# Patient Record
Sex: Female | Born: 1980 | Race: White | Hispanic: No | Marital: Married | Smoking: Never smoker
Health system: Southern US, Community
[De-identification: ages and names within clinical notes are randomized; demographics above are authoritative.]

## PROBLEM LIST (undated history)

## (undated) ENCOUNTER — Inpatient Hospital Stay (HOSPITAL_COMMUNITY): Payer: Self-pay

## (undated) DIAGNOSIS — Z789 Other specified health status: Secondary | ICD-10-CM

## (undated) HISTORY — DX: Other specified health status: Z78.9

## (undated) HISTORY — PX: NO PAST SURGERIES: SHX2092

---

## 2012-09-07 ENCOUNTER — Other Ambulatory Visit (HOSPITAL_COMMUNITY)
Admission: RE | Admit: 2012-09-07 | Discharge: 2012-09-07 | Disposition: A | Discharge status: Home / Self Care | Payer: Commercial Indemnity | Source: Ambulatory Visit | Attending: Obstetrics and Gynecology | Admitting: Obstetrics and Gynecology

## 2012-09-07 DIAGNOSIS — Z01419 Encounter for gynecological examination (general) (routine) without abnormal findings: Secondary | ICD-10-CM | POA: Insufficient documentation

## 2012-09-07 DIAGNOSIS — Z113 Encounter for screening for infections with a predominantly sexual mode of transmission: Secondary | ICD-10-CM | POA: Insufficient documentation

## 2012-12-09 ENCOUNTER — Inpatient Hospital Stay (HOSPITAL_COMMUNITY): Admission: AD | Admit: 2012-12-09 | Payer: Self-pay | Source: Ambulatory Visit | Admitting: Obstetrics and Gynecology

## 2013-05-02 LAB — OB RESULTS CONSOLE ABO/RH: RH TYPE: POSITIVE

## 2013-05-02 LAB — OB RESULTS CONSOLE ANTIBODY SCREEN: Antibody Screen: NEGATIVE

## 2013-05-02 LAB — OB RESULTS CONSOLE RUBELLA ANTIBODY, IGM: Rubella: IMMUNE

## 2013-05-02 LAB — OB RESULTS CONSOLE HIV ANTIBODY (ROUTINE TESTING): HIV: NONREACTIVE

## 2013-05-02 LAB — OB RESULTS CONSOLE RPR: RPR: NONREACTIVE

## 2013-05-02 LAB — OB RESULTS CONSOLE HEPATITIS B SURFACE ANTIGEN: Hepatitis B Surface Ag: NEGATIVE

## 2013-05-22 LAB — OB RESULTS CONSOLE GC/CHLAMYDIA
Chlamydia: NEGATIVE
Gonorrhea: NEGATIVE

## 2013-11-22 LAB — OB RESULTS CONSOLE GBS: GBS: NEGATIVE

## 2013-12-06 ENCOUNTER — Other Ambulatory Visit (HOSPITAL_COMMUNITY): Payer: Self-pay | Admitting: Obstetrics and Gynecology

## 2013-12-06 DIAGNOSIS — O26849 Uterine size-date discrepancy, unspecified trimester: Secondary | ICD-10-CM

## 2013-12-07 ENCOUNTER — Other Ambulatory Visit (HOSPITAL_COMMUNITY): Payer: Self-pay | Admitting: Obstetrics and Gynecology

## 2013-12-07 ENCOUNTER — Ambulatory Visit (HOSPITAL_COMMUNITY)
Admission: RE | Admit: 2013-12-07 | Discharge: 2013-12-07 | Disposition: A | Discharge status: Home / Self Care | Payer: BC Managed Care – PPO | Source: Ambulatory Visit | Attending: Obstetrics and Gynecology | Admitting: Obstetrics and Gynecology

## 2013-12-07 DIAGNOSIS — Z3689 Encounter for other specified antenatal screening: Secondary | ICD-10-CM | POA: Insufficient documentation

## 2013-12-07 DIAGNOSIS — O365990 Maternal care for other known or suspected poor fetal growth, unspecified trimester, not applicable or unspecified: Secondary | ICD-10-CM

## 2013-12-07 DIAGNOSIS — O26849 Uterine size-date discrepancy, unspecified trimester: Secondary | ICD-10-CM

## 2013-12-14 ENCOUNTER — Inpatient Hospital Stay (HOSPITAL_COMMUNITY)
Admission: AD | Admit: 2013-12-14 | Discharge: 2013-12-15 | Disposition: A | Discharge status: Home / Self Care | Payer: BC Managed Care – PPO | Source: Ambulatory Visit | Attending: Obstetrics and Gynecology | Admitting: Obstetrics and Gynecology

## 2013-12-14 ENCOUNTER — Encounter (HOSPITAL_COMMUNITY): Payer: Self-pay | Admitting: *Deleted

## 2013-12-14 DIAGNOSIS — O368130 Decreased fetal movements, third trimester, not applicable or unspecified: Secondary | ICD-10-CM

## 2013-12-14 DIAGNOSIS — O479 False labor, unspecified: Secondary | ICD-10-CM | POA: Insufficient documentation

## 2013-12-14 DIAGNOSIS — O368190 Decreased fetal movements, unspecified trimester, not applicable or unspecified: Secondary | ICD-10-CM | POA: Insufficient documentation

## 2013-12-14 NOTE — MAU Note (Signed)
PT SAYS   THE  BABY NL MOVES A LOT-    BUT ALL DAY TODAY-   MOVING LESS.   IN TRIAGE- FHR- 133.   LAST ATE AT 10PM-  CHOCOLATE-.    NO VE IN OFFICE.    DENIES  HSV AND MRSA.

## 2013-12-14 NOTE — MAU Provider Note (Signed)
Chief Complaint:  Decreased Fetal Movement   First Provider Initiated Contact with Patient 12/15/13 0003     HPI: Jacqueline Lynch is a 33 y.o. G2P0010 at 2146w6d who presents to maternity admissions reporting decreased FM. Reports mild contractions, no change from what she's been having for a while. Denies leakage of fluid or vaginal bleeding.  Ate snack, but no increased FM at home.  Past Medical History: History reviewed. No pertinent past medical history.  Past obstetric history: OB History  Gravida Para Term Preterm AB SAB TAB Ectopic Multiple Living  2    1 1         # Outcome Date GA Lbr Len/2nd Weight Sex Delivery Anes PTL Lv  2 CUR           1 SAB               Past Surgical History: History reviewed. No pertinent past surgical history.   Family History: History reviewed. No pertinent family history.  Social History: History  Substance Use Topics  . Smoking status: Never Smoker   . Smokeless tobacco: Never Used  . Alcohol Use: No    Allergies: No Known Allergies  Meds:  Prescriptions prior to admission  Medication Sig Dispense Refill  . Prenatal Vit-Fe Fumarate-FA (MULTIVITAMIN-PRENATAL) 27-0.8 MG TABS tablet Take 1 tablet by mouth daily at 12 noon.        ROS: Pertinent findings in history of present illness.  Physical Exam  Blood pressure 114/69, pulse 84, temperature 97.9 F (36.6 C), temperature source Oral, resp. rate 20, height 5\' 3"  (1.6 m), weight 73.143 kg (161 lb 4 oz). GENERAL: Well-developed, well-nourished female in no acute distress.  HEENT: normocephalic HEART: normal rate RESP: normal effort ABDOMEN: Soft, non-tender, gravid appropriate for gestational age EXTREMITIES: Nontender, no edema NEURO: alert and oriented SPECULUM EXAM: Deferred.   FHT:  Baseline 130 , moderate variability, accelerations present, no decelerations Contractions: Irreg, mild   Labs: No results found for this or any previous visit (from the past 24  hour(s)).  Imaging:  NA  MAU Course: Feeling Fm during MAU visit.   Assessment: 1. Decreased fetal movements in third trimester   Reactive NST.  Plan: Discharge home in stable condition per consutl w./ Dr. Stefano GaulStringer. Labor precautions and fetal kick counts.     Follow-up Information   Follow up with [name],[name], MD On 12/21/2013. (As scheduled or as needed if symptoms worsen.)    Specialty:  Obstetrics and Gynecology   Contact information:   [address] [place] [address] 564 [phone]       [facility]. (As needed If symptoms worsen)    Contact information:   [address] [id number] [place] [id number] [phone]       Medication List         multivitamin-prenatal 27-0.8 MG Tabs tablet  Take 1 tablet by mouth daily at 12 noon.        [place] Ryelan Kazee, CNM 12/15/2013 12:06 AM

## 2013-12-15 DIAGNOSIS — O368190 Decreased fetal movements, unspecified trimester, not applicable or unspecified: Secondary | ICD-10-CM

## 2013-12-15 NOTE — Discharge Instructions (Signed)
Braxton Hicks Contractions Pregnancy is commonly associated with contractions of the uterus throughout the pregnancy. Towards the end of pregnancy (32 to 34 weeks), these contractions (Braxton Hicks) can develop more often and may become more forceful. This is not true labor because these contractions do not result in opening (dilatation) and thinning of the cervix. They are sometimes difficult to tell apart from true labor because these contractions can be forceful and people have different pain tolerances. You should not feel embarrassed if you go to the hospital with false labor. Sometimes, the only way to tell if you are in true labor is for your caregiver to follow the changes in the cervix. How to tell the difference between true and false labor:  False labor.  The contractions of false labor are usually shorter, irregular and not as hard as those of true labor.  They are often felt in the front of the lower abdomen and in the groin.  They may leave with walking around or changing positions while lying down.  They get weaker and are shorter lasting as time goes on.  These contractions are usually irregular.  They do not usually become progressively stronger, regular and closer together as with true labor.  True labor.  Contractions in true labor last 30 to 70 seconds, become very regular, usually become more intense, and increase in frequency.  They do not go away with walking.  The discomfort is usually felt in the top of the uterus and spreads to the lower abdomen and low back.  True labor can be determined by your caregiver with an exam. This will show that the cervix is dilating and getting thinner. If there are no prenatal problems or other health problems associated with the pregnancy, it is completely safe to be sent home with false labor and await the onset of true labor. HOME CARE INSTRUCTIONS   Keep up with your usual exercises and instructions.  Take medications as  directed.  Keep your regular prenatal appointment.  Eat and drink lightly if you think you are going into labor.  If BH contractions are making you uncomfortable:  Change your activity position from lying down or resting to walking/walking to resting.  Sit and rest in a tub of warm water.  Drink 2 to 3 glasses of water. Dehydration may cause B-H contractions.  Do slow and deep breathing several times an hour. SEEK IMMEDIATE MEDICAL CARE IF:   Your contractions continue to become stronger, more regular, and closer together.  You have a gushing, burst or leaking of fluid from the vagina.  An oral temperature above 102 F (38.9 C) develops.  You have passage of blood-tinged mucus.  You develop vaginal bleeding.  You develop continuous belly (abdominal) pain.  You have low back pain that you never had before.  You feel the baby's head pushing down causing pelvic pressure.  The baby is not moving as much as it used to. Document Released: 09/14/2005 Document Revised: 12/07/2011 Document Reviewed: 06/26/2013 Exit[employer] Patient Information 2014 [employer].  Fetal Movement Counts Patient Name: __________________________________________________ Patient Due Date: ____________________ Performing a fetal movement count is highly recommended in high-risk pregnancies, but it is good for every pregnant woman to do. Your caregiver may ask you to start counting fetal movements at 28 weeks of the pregnancy. Fetal movements often increase:  After eating a full meal.  After physical activity.  After eating or drinking something sweet or cold.  At rest. Pay attention to when you feel   the baby is most active. This will help you notice a pattern of your baby's sleep and wake cycles and what factors contribute to an increase in fetal movement. It is important to perform a fetal movement count at the same time each day when your baby is normally most active.  HOW TO COUNT FETAL  MOVEMENTS 1. Find a quiet and comfortable area to sit or lie down on your left side. Lying on your left side provides the best blood and oxygen circulation to your baby. 2. Write down the day and time on a sheet of paper or in a journal. 3. Start counting kicks, flutters, swishes, rolls, or jabs in a 2 hour period. You should feel at least 10 movements within 2 hours. 4. If you do not feel 10 movements in 2 hours, wait 2 3 hours and count again. Look for a change in the pattern or not enough counts in 2 hours. SEEK MEDICAL CARE IF:  You feel less than 10 counts in 2 hours, tried twice.  There is no movement in over an hour.  The pattern is changing or taking longer each day to reach 10 counts in 2 hours.  You feel the baby is not moving as he or she usually does. Date: ____________ Movements: ____________ Start time: ____________ Finish time: ____________  Date: ____________ Movements: ____________ Start time: ____________ Finish time: ____________ Date: ____________ Movements: ____________ Start time: ____________ Finish time: ____________ Date: ____________ Movements: ____________ Start time: ____________ Finish time: ____________ Date: ____________ Movements: ____________ Start time: ____________ Finish time: ____________ Date: ____________ Movements: ____________ Start time: ____________ Finish time: ____________ Date: ____________ Movements: ____________ Start time: ____________ Finish time: ____________ Date: ____________ Movements: ____________ Start time: ____________ Finish time: ____________  Date: ____________ Movements: ____________ Start time: ____________ Finish time: ____________ Date: ____________ Movements: ____________ Start time: ____________ Finish time: ____________ Date: ____________ Movements: ____________ Start time: ____________ Finish time: ____________ Date: ____________ Movements: ____________ Start time: ____________ Finish time: ____________ Date: ____________  Movements: ____________ Start time: ____________ Finish time: ____________ Date: ____________ Movements: ____________ Start time: ____________ Finish time: ____________ Date: ____________ Movements: ____________ Start time: ____________ Finish time: ____________  Date: ____________ Movements: ____________ Start time: ____________ Finish time: ____________ Date: ____________ Movements: ____________ Start time: ____________ Finish time: ____________ Date: ____________ Movements: ____________ Start time: ____________ Finish time: ____________ Date: ____________ Movements: ____________ Start time: ____________ Finish time: ____________ Date: ____________ Movements: ____________ Start time: ____________ Finish time: ____________ Date: ____________ Movements: ____________ Start time: ____________ Finish time: ____________ Date: ____________ Movements: ____________ Start time: ____________ Finish time: ____________  Date: ____________ Movements: ____________ Start time: ____________ Finish time: ____________ Date: ____________ Movements: ____________ Start time: ____________ Finish time: ____________ Date: ____________ Movements: ____________ Start time: ____________ Finish time: ____________ Date: ____________ Movements: ____________ Start time: ____________ Finish time: ____________ Date: ____________ Movements: ____________ Start time: ____________ Finish time: ____________ Date: ____________ Movements: ____________ Start time: ____________ Finish time: ____________ Date: ____________ Movements: ____________ Start time: ____________ Finish time: ____________  Date: ____________ Movements: ____________ Start time: ____________ Finish time: ____________ Date: ____________ Movements: ____________ Start time: ____________ Finish time: ____________ Date: ____________ Movements: ____________ Start time: ____________ Finish time: ____________ Date: ____________ Movements: ____________ Start time:  ____________ Finish time: ____________ Date: ____________ Movements: ____________ Start time: ____________ Finish time: ____________ Date: ____________ Movements: ____________ Start time: ____________ Finish time: ____________ Date: ____________ Movements: ____________ Start time: ____________ Finish time: ____________  Date: ____________ Movements: ____________ Start time: ____________ Finish time: ____________ Date: ____________ Movements: ____________ Start   time: ____________ Finish time: ____________ Date: ____________ Movements: ____________ Start time: ____________ Finish time: ____________ Date: ____________ Movements: ____________ Start time: ____________ Finish time: ____________ Date: ____________ Movements: ____________ Start time: ____________ Finish time: ____________ Date: ____________ Movements: ____________ Start time: ____________ Finish time: ____________ Date: ____________ Movements: ____________ Start time: ____________ Finish time: ____________  Date: ____________ Movements: ____________ Start time: ____________ Finish time: ____________ Date: ____________ Movements: ____________ Start time: ____________ Finish time: ____________ Date: ____________ Movements: ____________ Start time: ____________ Finish time: ____________ Date: ____________ Movements: ____________ Start time: ____________ Finish time: ____________ Date: ____________ Movements: ____________ Start time: ____________ Finish time: ____________ Date: ____________ Movements: ____________ Start time: ____________ Finish time: ____________ Date: ____________ Movements: ____________ Start time: ____________ Finish time: ____________  Date: ____________ Movements: ____________ Start time: ____________ Finish time: ____________ Date: ____________ Movements: ____________ Start time: ____________ Finish time: ____________ Date: ____________ Movements: ____________ Start time: ____________ Finish time: ____________ Date:  ____________ Movements: ____________ Start time: ____________ Finish time: ____________ Date: ____________ Movements: ____________ Start time: ____________ Finish time: ____________ Date: ____________ Movements: ____________ Start time: ____________ Finish time: ____________ Document Released: 10/14/2006 Document Revised: 08/31/2012 Document Reviewed: 07/11/2012 [employer] Patient Information 2014 [employer].  

## 2013-12-25 ENCOUNTER — Telehealth (HOSPITAL_COMMUNITY): Payer: Self-pay | Admitting: *Deleted

## 2013-12-25 ENCOUNTER — Encounter (HOSPITAL_COMMUNITY): Payer: Self-pay | Admitting: *Deleted

## 2013-12-25 NOTE — Telephone Encounter (Signed)
Preadmission screen  

## 2013-12-27 ENCOUNTER — Other Ambulatory Visit: Payer: Self-pay | Admitting: Obstetrics and Gynecology

## 2013-12-28 ENCOUNTER — Inpatient Hospital Stay (HOSPITAL_COMMUNITY): Payer: BC Managed Care – PPO | Admitting: Anesthesiology

## 2013-12-28 ENCOUNTER — Inpatient Hospital Stay (HOSPITAL_COMMUNITY)
Admission: RE | Admit: 2013-12-28 | Discharge: 2013-12-31 | DRG: 766 | Disposition: A | Discharge status: Home / Self Care | Payer: BC Managed Care – PPO | Source: Ambulatory Visit | Attending: Obstetrics and Gynecology | Admitting: Obstetrics and Gynecology

## 2013-12-28 ENCOUNTER — Encounter (HOSPITAL_COMMUNITY): Payer: Self-pay

## 2013-12-28 ENCOUNTER — Encounter (HOSPITAL_COMMUNITY)
Admission: RE | Disposition: A | Discharge status: Home / Self Care | Payer: Self-pay | Source: Ambulatory Visit | Attending: Obstetrics and Gynecology

## 2013-12-28 ENCOUNTER — Encounter (HOSPITAL_COMMUNITY): Payer: BC Managed Care – PPO | Admitting: Anesthesiology

## 2013-12-28 DIAGNOSIS — Z98891 History of uterine scar from previous surgery: Secondary | ICD-10-CM

## 2013-12-28 DIAGNOSIS — O324XX0 Maternal care for high head at term, not applicable or unspecified: Secondary | ICD-10-CM | POA: Diagnosis present

## 2013-12-28 DIAGNOSIS — O480 Post-term pregnancy: Principal | ICD-10-CM | POA: Diagnosis present

## 2013-12-28 LAB — CBC
HCT: 36.6 % (ref 36.0–46.0)
Hemoglobin: 12.4 g/dL (ref 12.0–15.0)
MCH: 30.5 pg (ref 26.0–34.0)
MCHC: 33.9 g/dL (ref 30.0–36.0)
MCV: 90.1 fL (ref 78.0–100.0)
PLATELETS: 187 10*3/uL (ref 150–400)
RBC: 4.06 MIL/uL (ref 3.87–5.11)
RDW: 12.8 % (ref 11.5–15.5)
WBC: 9.7 10*3/uL (ref 4.0–10.5)

## 2013-12-28 LAB — ABO/RH: ABO/RH(D): O POS

## 2013-12-28 LAB — TYPE AND SCREEN
ABO/RH(D): O POS
Antibody Screen: NEGATIVE

## 2013-12-28 LAB — RPR: RPR: NONREACTIVE

## 2013-12-28 SURGERY — Surgical Case
Anesthesia: Epidural | Site: Abdomen

## 2013-12-28 MED ORDER — DIPHENHYDRAMINE HCL 25 MG PO CAPS: 25.0000 mg | ORAL_CAPSULE | Freq: Four times a day (QID) | ORAL | Status: DC | PRN

## 2013-12-28 MED ORDER — PRENATAL MULTIVITAMIN CH
1.0000 | ORAL_TABLET | Freq: Every day | ORAL | Status: DC
  Administered 2013-12-29 – 2013-12-31 (×3): 1 via ORAL
  Filled 2013-12-28 (×3): qty 1

## 2013-12-28 MED ORDER — KETOROLAC TROMETHAMINE 30 MG/ML IJ SOLN: 30.0000 mg | Freq: Four times a day (QID) | INTRAMUSCULAR | Status: DC | PRN

## 2013-12-28 MED ORDER — METOCLOPRAMIDE HCL 5 MG/ML IJ SOLN
10.0000 mg | Freq: Three times a day (TID) | INTRAMUSCULAR | Status: DC | PRN
Start: 2013-12-28 — End: 2013-12-31

## 2013-12-28 MED ORDER — KETOROLAC TROMETHAMINE 60 MG/2ML IM SOLN
60.0000 mg | Freq: Once | INTRAMUSCULAR | Status: AC | PRN
  Administered 2013-12-28: 60 mg via INTRAMUSCULAR

## 2013-12-28 MED ORDER — LANOLIN HYDROUS EX OINT: 1.0000 "application " | TOPICAL_OINTMENT | CUTANEOUS | Status: DC | PRN

## 2013-12-28 MED ORDER — DIPHENHYDRAMINE HCL 50 MG/ML IJ SOLN: 25.0000 mg | INTRAMUSCULAR | Status: DC | PRN

## 2013-12-28 MED ORDER — METHYLERGONOVINE MALEATE 0.2 MG/ML IJ SOLN: 0.2000 mg | INTRAMUSCULAR | Status: DC | PRN

## 2013-12-28 MED ORDER — DIPHENHYDRAMINE HCL 25 MG PO CAPS: 25.0000 mg | ORAL_CAPSULE | ORAL | Status: DC | PRN

## 2013-12-28 MED ORDER — NALBUPHINE HCL 10 MG/ML IJ SOLN
5.0000 mg | INTRAMUSCULAR | Status: DC | PRN
  Administered 2013-12-29 (×3): 10 mg via INTRAVENOUS
  Filled 2013-12-28 (×2): qty 1

## 2013-12-28 MED ORDER — ZOLPIDEM TARTRATE 5 MG PO TABS: 5.0000 mg | ORAL_TABLET | Freq: Every evening | ORAL | Status: DC | PRN

## 2013-12-28 MED ORDER — ONDANSETRON HCL 4 MG/2ML IJ SOLN
INTRAMUSCULAR | Status: AC
  Filled 2013-12-28: qty 2

## 2013-12-28 MED ORDER — LACTATED RINGERS IV SOLN
INTRAVENOUS | Status: DC | PRN
  Administered 2013-12-28: 21:00:00 via INTRAVENOUS

## 2013-12-28 MED ORDER — METOCLOPRAMIDE HCL 5 MG/ML IJ SOLN
INTRAMUSCULAR | Status: AC
  Administered 2013-12-28: 10 mg via INTRAVENOUS
  Filled 2013-12-28: qty 2

## 2013-12-28 MED ORDER — MENTHOL 3 MG MT LOZG: 1.0000 | LOZENGE | OROMUCOSAL | Status: DC | PRN

## 2013-12-28 MED ORDER — ACETAMINOPHEN 325 MG PO TABS: 650.0000 mg | ORAL_TABLET | ORAL | Status: DC | PRN

## 2013-12-28 MED ORDER — IBUPROFEN 600 MG PO TABS
600.0000 mg | ORAL_TABLET | Freq: Four times a day (QID) | ORAL | Status: DC
  Administered 2013-12-29 – 2013-12-31 (×10): 600 mg via ORAL
  Filled 2013-12-28 (×10): qty 1

## 2013-12-28 MED ORDER — DIPHENHYDRAMINE HCL 50 MG/ML IJ SOLN: 12.5000 mg | INTRAMUSCULAR | Status: DC | PRN

## 2013-12-28 MED ORDER — METHYLERGONOVINE MALEATE 0.2 MG PO TABS: 0.2000 mg | ORAL_TABLET | ORAL | Status: DC | PRN

## 2013-12-28 MED ORDER — OXYTOCIN 10 UNIT/ML IJ SOLN
INTRAMUSCULAR | Status: AC
  Filled 2013-12-28: qty 2

## 2013-12-28 MED ORDER — DIBUCAINE 1 % RE OINT: 1.0000 "application " | TOPICAL_OINTMENT | RECTAL | Status: DC | PRN

## 2013-12-28 MED ORDER — METOCLOPRAMIDE HCL 5 MG/ML IJ SOLN: 10.0000 mg | Freq: Once | INTRAMUSCULAR | Status: DC | PRN

## 2013-12-28 MED ORDER — MORPHINE SULFATE (PF) 0.5 MG/ML IJ SOLN
INTRAMUSCULAR | Status: DC | PRN
  Administered 2013-12-28: 4 mg via EPIDURAL

## 2013-12-28 MED ORDER — KETOROLAC TROMETHAMINE 60 MG/2ML IM SOLN
INTRAMUSCULAR | Status: AC
  Administered 2013-12-28: 60 mg via INTRAMUSCULAR
  Filled 2013-12-28: qty 2

## 2013-12-28 MED ORDER — PHENYLEPHRINE 40 MCG/ML (10ML) SYRINGE FOR IV PUSH (FOR BLOOD PRESSURE SUPPORT)
PREFILLED_SYRINGE | INTRAVENOUS | Status: AC
  Filled 2013-12-28: qty 5

## 2013-12-28 MED ORDER — SODIUM CHLORIDE 0.9 % IJ SOLN: 3.0000 mL | INTRAMUSCULAR | Status: DC | PRN

## 2013-12-28 MED ORDER — PHENYLEPHRINE 40 MCG/ML (10ML) SYRINGE FOR IV PUSH (FOR BLOOD PRESSURE SUPPORT): 80.0000 ug | PREFILLED_SYRINGE | INTRAVENOUS | Status: DC | PRN

## 2013-12-28 MED ORDER — EPHEDRINE 5 MG/ML INJ
10.0000 mg | INTRAVENOUS | Status: DC | PRN
  Filled 2013-12-28: qty 4

## 2013-12-28 MED ORDER — ONDANSETRON HCL 4 MG PO TABS: 4.0000 mg | ORAL_TABLET | ORAL | Status: DC | PRN

## 2013-12-28 MED ORDER — SIMETHICONE 80 MG PO CHEW
80.0000 mg | CHEWABLE_TABLET | ORAL | Status: DC
  Administered 2013-12-29 – 2013-12-31 (×3): 80 mg via ORAL
  Filled 2013-12-28 (×3): qty 1

## 2013-12-28 MED ORDER — OXYTOCIN 10 UNIT/ML IJ SOLN
INTRAMUSCULAR | Status: AC
  Filled 2013-12-28: qty 1

## 2013-12-28 MED ORDER — SIMETHICONE 80 MG PO CHEW: 80.0000 mg | CHEWABLE_TABLET | ORAL | Status: DC | PRN

## 2013-12-28 MED ORDER — PHENYLEPHRINE HCL 10 MG/ML IJ SOLN
INTRAMUSCULAR | Status: DC | PRN
  Administered 2013-12-28: 80 ug via INTRAVENOUS

## 2013-12-28 MED ORDER — SCOPOLAMINE 1 MG/3DAYS TD PT72
MEDICATED_PATCH | TRANSDERMAL | Status: AC
  Filled 2013-12-28: qty 1

## 2013-12-28 MED ORDER — FENTANYL 2.5 MCG/ML BUPIVACAINE 1/10 % EPIDURAL INFUSION (WH - ANES)
14.0000 mL/h | INTRAMUSCULAR | Status: DC | PRN
  Administered 2013-12-28: 14 mL/h via EPIDURAL
  Filled 2013-12-28: qty 125

## 2013-12-28 MED ORDER — ONDANSETRON HCL 4 MG/2ML IJ SOLN
INTRAMUSCULAR | Status: DC | PRN
  Administered 2013-12-28: 4 mg via INTRAVENOUS

## 2013-12-28 MED ORDER — LACTATED RINGERS IV SOLN
500.0000 mL | INTRAVENOUS | Status: DC | PRN
  Administered 2013-12-28: 500 mL via INTRAVENOUS
  Administered 2013-12-28: 400 mL via INTRAVENOUS

## 2013-12-28 MED ORDER — IBUPROFEN 600 MG PO TABS: 600.0000 mg | ORAL_TABLET | Freq: Four times a day (QID) | ORAL | Status: DC | PRN

## 2013-12-28 MED ORDER — BUTORPHANOL TARTRATE 1 MG/ML IJ SOLN: 1.0000 mg | INTRAMUSCULAR | Status: DC | PRN

## 2013-12-28 MED ORDER — ONDANSETRON HCL 4 MG/2ML IJ SOLN: 4.0000 mg | Freq: Four times a day (QID) | INTRAMUSCULAR | Status: DC | PRN

## 2013-12-28 MED ORDER — FENTANYL CITRATE 0.05 MG/ML IJ SOLN
25.0000 ug | INTRAMUSCULAR | Status: DC | PRN
  Administered 2013-12-28 (×2): 50 ug via INTRAVENOUS

## 2013-12-28 MED ORDER — ONDANSETRON HCL 4 MG/2ML IJ SOLN
4.0000 mg | INTRAMUSCULAR | Status: DC | PRN
Start: 2013-12-28 — End: 2013-12-31

## 2013-12-28 MED ORDER — OXYTOCIN 40 UNITS IN LACTATED RINGERS INFUSION - SIMPLE MED: 62.5000 mL/h | INTRAVENOUS | Status: AC

## 2013-12-28 MED ORDER — EPHEDRINE 5 MG/ML INJ: 10.0000 mg | INTRAVENOUS | Status: DC | PRN

## 2013-12-28 MED ORDER — MEPERIDINE HCL 25 MG/ML IJ SOLN
INTRAMUSCULAR | Status: DC | PRN
  Administered 2013-12-28 (×3): 6 mg via INTRAVENOUS
  Administered 2013-12-28: 7 mg via INTRAVENOUS

## 2013-12-28 MED ORDER — CEFAZOLIN SODIUM-DEXTROSE 2-3 GM-% IV SOLR
INTRAVENOUS | Status: DC | PRN
  Administered 2013-12-28: 2 g via INTRAVENOUS

## 2013-12-28 MED ORDER — LACTATED RINGERS IV SOLN
500.0000 mL | Freq: Once | INTRAVENOUS | Status: AC
Start: 2013-12-28 — End: 2013-12-28
  Administered 2013-12-28: 500 mL via INTRAVENOUS

## 2013-12-28 MED ORDER — MEPERIDINE HCL 25 MG/ML IJ SOLN
INTRAMUSCULAR | Status: AC
  Filled 2013-12-28: qty 1

## 2013-12-28 MED ORDER — SODIUM BICARBONATE 8.4 % IV SOLN
INTRAVENOUS | Status: DC | PRN
  Administered 2013-12-28 (×3): 5 mL via EPIDURAL

## 2013-12-28 MED ORDER — MORPHINE SULFATE 0.5 MG/ML IJ SOLN
INTRAMUSCULAR | Status: AC
  Filled 2013-12-28: qty 10

## 2013-12-28 MED ORDER — ONDANSETRON HCL 4 MG/2ML IJ SOLN: 4.0000 mg | Freq: Three times a day (TID) | INTRAMUSCULAR | Status: DC | PRN

## 2013-12-28 MED ORDER — 0.9 % SODIUM CHLORIDE (POUR BTL) OPTIME
TOPICAL | Status: DC | PRN
  Administered 2013-12-28: 1000 mL

## 2013-12-28 MED ORDER — NALOXONE HCL 1 MG/ML IJ SOLN
1.0000 ug/kg/h | INTRAVENOUS | Status: DC | PRN
  Filled 2013-12-28: qty 2

## 2013-12-28 MED ORDER — NALBUPHINE HCL 10 MG/ML IJ SOLN
5.0000 mg | INTRAMUSCULAR | Status: DC | PRN
  Filled 2013-12-28: qty 1

## 2013-12-28 MED ORDER — NALOXONE HCL 0.4 MG/ML IJ SOLN: 0.4000 mg | INTRAMUSCULAR | Status: DC | PRN

## 2013-12-28 MED ORDER — PHENYLEPHRINE 40 MCG/ML (10ML) SYRINGE FOR IV PUSH (FOR BLOOD PRESSURE SUPPORT)
80.0000 ug | PREFILLED_SYRINGE | INTRAVENOUS | Status: DC | PRN
  Filled 2013-12-28: qty 10

## 2013-12-28 MED ORDER — OXYTOCIN 40 UNITS IN LACTATED RINGERS INFUSION - SIMPLE MED
1.0000 m[IU]/min | INTRAVENOUS | Status: DC
  Administered 2013-12-28: 2 m[IU]/min via INTRAVENOUS
  Filled 2013-12-28: qty 1000

## 2013-12-28 MED ORDER — LACTATED RINGERS IV SOLN
INTRAVENOUS | Status: DC
  Administered 2013-12-29: 01:00:00 via INTRAVENOUS

## 2013-12-28 MED ORDER — CITRIC ACID-SODIUM CITRATE 334-500 MG/5ML PO SOLN
30.0000 mL | ORAL | Status: DC | PRN
  Administered 2013-12-28: 30 mL via ORAL
  Filled 2013-12-28: qty 15

## 2013-12-28 MED ORDER — OXYTOCIN 10 UNIT/ML IJ SOLN
40.0000 [IU] | INTRAVENOUS | Status: DC | PRN
  Administered 2013-12-28: 40 [IU] via INTRAVENOUS

## 2013-12-28 MED ORDER — FENTANYL CITRATE 0.05 MG/ML IJ SOLN
INTRAMUSCULAR | Status: AC
  Administered 2013-12-28: 50 ug via INTRAVENOUS
  Filled 2013-12-28: qty 2

## 2013-12-28 MED ORDER — METOCLOPRAMIDE HCL 5 MG/ML IJ SOLN
10.0000 mg | Freq: Once | INTRAMUSCULAR | Status: AC
  Administered 2013-12-28: 10 mg via INTRAVENOUS

## 2013-12-28 MED ORDER — SCOPOLAMINE 1 MG/3DAYS TD PT72
1.0000 | MEDICATED_PATCH | Freq: Once | TRANSDERMAL | Status: DC
  Administered 2013-12-28: 1.5 mg via TRANSDERMAL

## 2013-12-28 MED ORDER — SENNOSIDES-DOCUSATE SODIUM 8.6-50 MG PO TABS
2.0000 | ORAL_TABLET | ORAL | Status: DC
  Administered 2013-12-29 – 2013-12-31 (×3): 2 via ORAL
  Filled 2013-12-28 (×3): qty 2

## 2013-12-28 MED ORDER — MEPERIDINE HCL 25 MG/ML IJ SOLN: 6.2500 mg | INTRAMUSCULAR | Status: DC | PRN

## 2013-12-28 MED ORDER — OXYCODONE-ACETAMINOPHEN 5-325 MG PO TABS: 1.0000 | ORAL_TABLET | ORAL | Status: DC | PRN

## 2013-12-28 MED ORDER — OXYTOCIN BOLUS FROM INFUSION: 500.0000 mL | INTRAVENOUS | Status: DC

## 2013-12-28 MED ORDER — TERBUTALINE SULFATE 1 MG/ML IJ SOLN: 0.2500 mg | Freq: Once | INTRAMUSCULAR | Status: DC | PRN

## 2013-12-28 MED ORDER — OXYTOCIN 40 UNITS IN LACTATED RINGERS INFUSION - SIMPLE MED: 62.5000 mL/h | INTRAVENOUS | Status: DC

## 2013-12-28 MED ORDER — SIMETHICONE 80 MG PO CHEW
80.0000 mg | CHEWABLE_TABLET | Freq: Three times a day (TID) | ORAL | Status: DC
  Administered 2013-12-29 – 2013-12-31 (×7): 80 mg via ORAL
  Filled 2013-12-28 (×7): qty 1

## 2013-12-28 MED ORDER — WITCH HAZEL-GLYCERIN EX PADS: 1.0000 "application " | MEDICATED_PAD | CUTANEOUS | Status: DC | PRN

## 2013-12-28 MED ORDER — FERROUS SULFATE 325 (65 FE) MG PO TABS
325.0000 mg | ORAL_TABLET | Freq: Two times a day (BID) | ORAL | Status: DC
  Administered 2013-12-29 – 2013-12-31 (×5): 325 mg via ORAL
  Filled 2013-12-28 (×5): qty 1

## 2013-12-28 MED ORDER — LACTATED RINGERS IV SOLN
INTRAVENOUS | Status: DC
  Administered 2013-12-28 (×3): via INTRAVENOUS

## 2013-12-28 MED ORDER — LIDOCAINE HCL (PF) 1 % IJ SOLN
30.0000 mL | INTRAMUSCULAR | Status: DC | PRN
  Filled 2013-12-28: qty 30

## 2013-12-28 MED ORDER — MORPHINE SULFATE (PF) 0.5 MG/ML IJ SOLN
INTRAMUSCULAR | Status: DC | PRN
  Administered 2013-12-28: 1 mg via INTRAVENOUS

## 2013-12-28 SURGICAL SUPPLY — 38 items
BARRIER ADHS 3X4 INTERCEED (GAUZE/BANDAGES/DRESSINGS) IMPLANT
BENZOIN TINCTURE PRP APPL 2/3 (GAUZE/BANDAGES/DRESSINGS) ×3 IMPLANT
CLAMP CORD UMBIL (MISCELLANEOUS) IMPLANT
CLOSURE WOUND 1/2 X4 (GAUZE/BANDAGES/DRESSINGS) ×1
CLOTH BEACON ORANGE TIMEOUT ST (SAFETY) ×3 IMPLANT
CONTAINER PREFILL 10% NBF 15ML (MISCELLANEOUS) IMPLANT
DRAPE LG THREE QUARTER DISP (DRAPES) IMPLANT
DRSG OPSITE POSTOP 4X10 (GAUZE/BANDAGES/DRESSINGS) ×3 IMPLANT
DURAPREP 26ML APPLICATOR (WOUND CARE) ×3 IMPLANT
ELECT REM PT RETURN 9FT ADLT (ELECTROSURGICAL) ×3
ELECTRODE REM PT RTRN 9FT ADLT (ELECTROSURGICAL) ×1 IMPLANT
EXTRACTOR VACUUM M CUP 4 TUBE (SUCTIONS) IMPLANT
EXTRACTOR VACUUM M CUP 4' TUBE (SUCTIONS)
GLOVE BIOGEL M 6.5 STRL (GLOVE) ×6 IMPLANT
GLOVE BIOGEL PI IND STRL 6.5 (GLOVE) ×1 IMPLANT
GLOVE BIOGEL PI INDICATOR 6.5 (GLOVE) ×2
GOWN STRL REUS W/TWL LRG LVL3 (GOWN DISPOSABLE) ×9 IMPLANT
KIT ABG SYR 3ML LUER SLIP (SYRINGE) IMPLANT
NEEDLE HYPO 25X5/8 SAFETYGLIDE (NEEDLE) IMPLANT
NS IRRIG 1000ML POUR BTL (IV SOLUTION) ×3 IMPLANT
PACK C SECTION WH (CUSTOM PROCEDURE TRAY) ×3 IMPLANT
PAD OB MATERNITY 4.3X12.25 (PERSONAL CARE ITEMS) ×3 IMPLANT
RTRCTR C-SECT PINK 25CM LRG (MISCELLANEOUS) IMPLANT
RTRCTR C-SECT PINK 34CM XLRG (MISCELLANEOUS) IMPLANT
STAPLER VISISTAT 35W (STAPLE) IMPLANT
STRIP CLOSURE SKIN 1/2X4 (GAUZE/BANDAGES/DRESSINGS) ×2 IMPLANT
SUT PDS AB 0 CT1 27 (SUTURE) ×6 IMPLANT
SUT PLAIN 0 NONE (SUTURE) IMPLANT
SUT VIC AB 0 CTX 36 (SUTURE) ×6
SUT VIC AB 0 CTX36XBRD ANBCTRL (SUTURE) ×3 IMPLANT
SUT VIC AB 2-0 CT1 27 (SUTURE) ×2
SUT VIC AB 2-0 CT1 TAPERPNT 27 (SUTURE) ×1 IMPLANT
SUT VIC AB 3-0 SH 27 (SUTURE)
SUT VIC AB 3-0 SH 27X BRD (SUTURE) IMPLANT
SUT VIC AB 4-0 KS 27 (SUTURE) ×3 IMPLANT
TOWEL OR 17X24 6PK STRL BLUE (TOWEL DISPOSABLE) ×3 IMPLANT
TRAY FOLEY CATH 14FR (SET/KITS/TRAYS/PACK) ×3 IMPLANT
WATER STERILE IRR 1000ML POUR (IV SOLUTION) ×3 IMPLANT

## 2013-12-28 NOTE — Progress Notes (Signed)
Manha Nils FlackGabrrica is a 33 y.o. G2P0010 at 6537w6d by LMP admitted for induction of labor due to Elective at term.  Subjective: Pt feeling contraction +FM  Objective: BP 112/50  Pulse 78  Temp(Src) 98.3 F (36.8 C) (Oral)  Resp 20  Ht 5\' 4"  (1.626 m)  Wt 71 kg (156 lb 8.4 oz)  BMI 26.85 kg/m2  SpO2 98%      FHT:  FHR: 130 bpm, variability: moderate,  accelerations:  Present,  decelerations:  Absent UC:   regular, every 2-3 minutes SVE:   3.5/75/-1 AROM clear fluid IUPC placed  Labs: Lab Results  Component Value Date   WBC 9.7 12/28/2013   HGB 12.4 12/28/2013   HCT 36.6 12/28/2013   MCV 90.1 12/28/2013   PLT 187 12/28/2013    Assessment / Plan: 40 wks 6 days for induction / continue pitocin   Labor: Progressing normally Preeclampsia:  NA Fetal Wellbeing:  Category I Pain Control:  Epidural I/D:  n/a Anticipated MOD:  NSVD  Duncan Alejandro J. 12/28/2013, 6:02 PM

## 2013-12-28 NOTE — Progress Notes (Signed)
Jacqueline Lynch is a 33 y.o. G2P0010 at 8730w6d by LMP admitted for induction of labor due to Elective at term.  Subjective:  pt tired she has been pushing for 3 hours    Objective: BP 121/58  Pulse 77  Temp(Src) 98.2 F (36.8 C) (Oral)  Resp 20  Ht 5\' 4"  (1.626 m)  Wt 71 kg (156 lb 8.4 oz)  BMI 26.85 kg/m2  SpO2 79% I/O last 3 completed shifts: In: -  Out: 200 [Urine:200] Total I/O In: 2000 [I.V.:2000] Out: 800 [Urine:100; Blood:700]  FHT:  FHR: 130 bpm, variability: moderate,  accelerations:  Abscent,  decelerations:  Present variable and intermittent late decelerations to the 90's UC:   regular, every 2-3 minutes SVE:   Dilation: 10 Effacement (%): 100 Station: +2 Exam by:: dr. Richardson Lynch  Labs: Lab Results  Component Value Date   WBC 9.7 12/28/2013   HGB 12.4 12/28/2013   HCT 36.6 12/28/2013   MCV 90.1 12/28/2013   PLT 187 12/28/2013    Assessment / Plan: Arrest of decent  Labor:  patient was consented for attempt with vacuum extraction ... she is informed that  the risk of vacuum is failure/ pop offs/ hematomas/ possible cesarean section. Agrees to attempt vacuum extraction.  The bladder was drained. fetal position OP.. vacuum was applied and pressure  was applied during contraction and maternal pushing. No descent noted. recommend cesarean section for failure to descend and nonreassuring fetal heart rate. r/b/a of cesarean section discussed with the patient including but not limited to infection. Bleeding damage to bowel bladder baby with the need for further surgery. patient voiced understanding and desires to proceed with cesarean section.  Preeclampsia:  NA Fetal Wellbeing:  Category II Pain Control:  Epidural I/D:  ANCEF 2 grams OCTOR  Anticipated MOD:  Cesarean section  Jacqueline Lynch. 12/28/2013, 9:57 PM

## 2013-12-28 NOTE — Transfer of Care (Signed)
Immediate Anesthesia Transfer of Care Note  Patient: Jacqueline PersiaAdile Lynch  Procedure(s) Performed: Procedure(s): CESAREAN SECTION (N/A)  Patient Location: PACU  Anesthesia Type:Epidural  Level of Consciousness: awake, alert  and oriented  Airway & Oxygen Therapy: Patient Spontanous Breathing  Post-op Assessment: Report given to PACU RN and Post -op Vital signs reviewed and stable  Post vital signs: Reviewed and stable  Complications: No apparent anesthesia complications

## 2013-12-28 NOTE — H&P (Signed)
Jacqueline Lynch is a 33 y.o. female  G2P0010 at 40 wks and 6 days based on LMP confirmed by u/S with EDD 12/22/2013 presents for induction due to postdates. Pregnancy has been uncomplicated. +FM no lof no vaginal bleeding. Mild contractions    History OB History   Grav Para Term Preterm Abortions TAB SAB Ect Mult Living   2    1  1         Past Medical History  Diagnosis Date  . Medical history non-contributory    Past Surgical History  Procedure Laterality Date  . No past surgeries     Family History: family history is negative for Alcohol abuse, Arthritis, Asthma, Birth defects, Cancer, COPD, Depression, Diabetes, Drug abuse, Early death, Hearing loss, Heart disease, Hyperlipidemia, Hypertension, Kidney disease, Learning disabilities, Mental illness, Mental retardation, Miscarriages / Stillbirths, Stroke, and Vision loss. Social History:  reports that she has never smoked. She has never used smokeless tobacco. She reports that she does not drink alcohol or use illicit drugs.   Prenatal Transfer Tool  Maternal Diabetes: No Genetic Screening: Normal Maternal Ultrasounds/Referrals: Normal Fetal Ultrasounds or other Referrals:  None Maternal Substance Abuse:  No Significant Maternal Medications:  None Significant Maternal Lab Results:  Lab values include: Group B Strep negative Other Comments:  None  Review of Systems  All other systems reviewed and are negative.     Blood pressure 114/70, pulse 86, temperature 98.5 F (36.9 C), temperature source Oral, resp. rate 20, height 5\' 4"  (1.626 m), weight 71 kg (156 lb 8.4 oz), SpO2 98.00%.   Fetal Exam Fetal Monitor Review: Mode: fetoscope.   Baseline rate: 120.  Variability: moderate (6-25 bpm).   Pattern: accelerations present and no decelerations.    Fetal State Assessment: Category I - tracings are normal.     Physical Exam  Vitals reviewed. Constitutional: She is oriented to person, place, and time. She appears  well-developed and well-nourished.  HENT:  Head: Normocephalic and atraumatic.  Neck: Normal range of motion.  Cardiovascular: Normal rate and regular rhythm.   Respiratory: Effort normal and breath sounds normal.  GI: There is no tenderness.  Genitourinary: Vagina normal.  Cervix 2.5 cm per nurse exam   Musculoskeletal: Normal range of motion.  Neurological: She is alert and oriented to person, place, and time.  Skin: Skin is warm and dry.  Psychiatric: She has a normal mood and affect.    Prenatal labs: ABO, Rh: O/Positive/-- (08/05 0000) Antibody: Negative (08/05 0000) Rubella: Immune (08/05 0000) RPR: NON REACTIVE (04/02 0735)  HBsAg: Negative (08/05 0000)  HIV: Non-reactive (08/05 0000)  GBS: Negative (02/25 0000)   Assessment/Plan: 40 wks and 6 days post dates for induction. Pt is aware of increased r/o cesarean section associated with induction and accepts this risk  Plan for pitocin.  Anticipate SVD.    Jacqueline Lynch J. 12/28/2013, 3:42 PM

## 2013-12-28 NOTE — Anesthesia Preprocedure Evaluation (Addendum)
Anesthesia Evaluation  Patient identified by MRN, date of birth, ID band Patient awake    Reviewed: Allergy & Precautions, H&P , Patient's Chart, lab work & pertinent test results  Airway Mallampati: II TM Distance: >3 FB Neck ROM: full    Dental  (+) Teeth Intact   Pulmonary  breath sounds clear to auscultation        Cardiovascular Rhythm:regular Rate:Normal     Neuro/Psych    GI/Hepatic   Endo/Other    Renal/GU      Musculoskeletal   Abdominal   Peds  Hematology   Anesthesia Other Findings       Reproductive/Obstetrics (+) Pregnancy (failed vacuum delivery)                          Anesthesia Physical Anesthesia Plan  ASA: II and emergent  Anesthesia Plan: Epidural   Post-op Pain Management:    Induction:   Airway Management Planned:   Additional Equipment:   Intra-op Plan:   Post-operative Plan:   Informed Consent: I have reviewed the patients History and Physical, chart, labs and discussed the procedure including the risks, benefits and alternatives for the proposed anesthesia with the patient or authorized representative who has indicated his/her understanding and acceptance.   Dental Advisory Given  Plan Discussed with: Surgeon and CRNA  Anesthesia Plan Comments: (Labs checked- platelets confirmed with RN in room. Fetal heart tracing, per RN, reported to be stable enough for sitting procedure. Discussed epidural, and patient consents to the procedure:  included risk of possible headache,backache, failed block, allergic reaction, and nerve injury. This patient was asked if she had any questions or concerns before the procedure started.)       Anesthesia Quick Evaluation

## 2013-12-28 NOTE — Anesthesia Procedure Notes (Signed)

## 2013-12-28 NOTE — Anesthesia Postprocedure Evaluation (Signed)
  Anesthesia Post-op Note  Anesthesia Post Note  Patient: Jacqueline PersiaAdile Calderwood  Procedure(s) Performed: Procedure(s) (LRB): Primary Cesarean Section Delivery Baby Boy @ 2109, Apgars 8/9 (N/A)  Anesthesia type: Epidural  Patient location: PACU  Post pain: Pain level controlled  Post assessment: Post-op Vital signs reviewed  Last Vitals:  Filed Vitals:   12/28/13 2315  BP: 112/68  Pulse: 86  Temp:   Resp: 29    Post vital signs: stable  Level of consciousness: awake  Complications: No apparent anesthesia complications

## 2013-12-28 NOTE — Op Note (Signed)
Cesarean Section Procedure Note  Indications: failure to progress: arrest of descent and non-reassuring fetal status  Pre-operative Diagnosis: 40 week 6 day pregnancy.  Post-operative Diagnosis: same  Surgeon: [name],Issiah Huffaker J.   Assistants: Sanda KleinShelley Lillard CNM  Anesthesia: Epidural anesthesia  ASA Class: 2   Procedure Details   The patient was seen in the Holding Room. The risks, benefits, complications, treatment options, and expected outcomes were discussed with the patient.  The patient concurred with the proposed plan, giving informed consent.  The site of surgery properly noted/marked. The patient was taken to Operating Room # 9, identified as Tod Persiadile Klimas and the procedure verified as C-Section Delivery. A Time Out was held and the above information confirmed.  After induction of anesthesia, the patient was draped and prepped in the usual sterile manner. A Pfannenstiel incision was made and carried down through the subcutaneous tissue to the fascia. Fascial incision was made and extended transversely. The fascia was separated from the underlying rectus tissue superiorly and inferiorly. The peritoneum was identified and entered. Peritoneal incision was extended longitudinally. The utero-vesical peritoneal reflection was incised transversely and the bladder flap was bluntly freed from the lower uterine segment. A low transverse uterine incision was made. Delivered from cephalic presentation was a  Female with Apgar scores of 8 at one minute and 9 at five minutes. After the umbilical cord was clamped and cut cord blood was obtained for evaluation. The placenta was removed intact and appeared normal. The uterine outline, tubes and ovaries appeared normal. The uterine incision was closed with running locked sutures of 0 Vicryl. A second layer of suture was used to imbricate the incision. Hemostasis was observed. Lavage was carried out until clear. The fascia was then reapproximated with running  sutures of 0 pds. The skin was reapproximated with 4-0 vicryl.  Instrument, sponge, and needle counts were correct prior the abdominal closure and at the conclusion of the case.   Findings: Female infant occiput posterior cephalic presentation... Normal fallopian tubes and ovaries   Estimated Blood Loss:  700 mL         Drains: Foley uop 100 mL         Total IV Fluids:  Per anesthesia ml         Specimens: Placenta and sent to Labor and delivery            Implants: none         Complications:  None; patient tolerated the procedure well.         Disposition: PACU - hemodynamically stable.         Condition: stable  Attending Attestation: I performed the procedure.

## 2013-12-29 ENCOUNTER — Encounter (HOSPITAL_COMMUNITY): Payer: Self-pay | Admitting: Obstetrics and Gynecology

## 2013-12-29 LAB — CBC
HEMATOCRIT: 32.8 % — AB (ref 36.0–46.0)
Hemoglobin: 11.1 g/dL — ABNORMAL LOW (ref 12.0–15.0)
MCH: 30.8 pg (ref 26.0–34.0)
MCHC: 33.8 g/dL (ref 30.0–36.0)
MCV: 91.1 fL (ref 78.0–100.0)
Platelets: 155 10*3/uL (ref 150–400)
RBC: 3.6 MIL/uL — AB (ref 3.87–5.11)
RDW: 12.9 % (ref 11.5–15.5)
WBC: 13 10*3/uL — ABNORMAL HIGH (ref 4.0–10.5)

## 2013-12-29 NOTE — Lactation Note (Signed)
This note was copied from the chart of Jacqueline Lynch. Lactation Consultation Note  Patient Name: Jacqueline Tod Persiadile Yoss ZOXWR'UToday's Date: 12/29/2013 Reason for consult: Follow-up assessment;Breast/nipple pain;Difficult latch, especially on (L) breast. LC assisted with latch to more difficult (L) breast using #20 NS.  LC demonstrated football position, latch techniques and chin tug for FOB to perform for wider latch.  LC arrived after a 15 minute feeding on mom's (R) breast and a diaper change with large meconium stool.  Mom has been only offering (R) breast and c/o nipple soreness.  LC did see some ebm in tip of NS and swallows also noted during feeding.  LC encouraged mom to try applying comfort gelpads to her nipples between feedings.  LC also encouraged mom to alternate breasts and offer (L) for next few feedings to allow (R) nipple to rest.  If baby not latching to (L), mom to use hand pump for additional stimulation for at least 10 minutes.    Maternal Data    Feeding Feeding Type: Breast Fed Length of feed: 10 min  LATCH Score/Interventions Latch: Grasps breast easily, tongue down, lips flanged, rhythmical sucking. (needs some help with initial wide latch) Intervention(s): Skin to skin;Teach feeding cues;Waking techniques (chin tug technique shown to FOB) Intervention(s): Adjust position;Assist with latch;Breast compression  Audible Swallowing: Spontaneous and intermittent Intervention(s): Skin to skin;Hand expression  Type of Nipple: Flat Intervention(s): Hand pump  Comfort (Breast/Nipple): Filling, red/small blisters or bruises, mild/mod discomfort     Hold (Positioning): Assistance needed to correctly position infant at breast and maintain latch. Intervention(s): Breastfeeding basics reviewed;Support Pillows;Position options;Skin to skin (football hold working better on (L) side)  LATCH Score: 7 (LC assisted on (L) breast, using #20 NS)  Lactation Tools Discussed/Used Tools:  Nipple Shields;Comfort gels Nipple shield size: 20 Latch techniques, positioning, chin tug technique  Consult Status Consult Status: Follow-up Date: 12/30/13 Follow-up type: In-patient    Warrick ParisianBryant, Lexiana Spindel [facility] 12/29/2013, 10:31 PM

## 2013-12-29 NOTE — Addendum Note (Signed)
Addendum created 12/29/13 0748 by Suella Groveoderick C Algernon Mundie, CRNA   Modules edited: Notes Section   Notes Section:  File: [id number]

## 2013-12-29 NOTE — Anesthesia Postprocedure Evaluation (Signed)
  Anesthesia Post-op Note  Patient: Jacqueline Lynch  Procedure(s) Performed: Procedure(s): Primary Cesarean Section Delivery Baby Boy @ 2109, Apgars 8/9 (N/A)  Patient Location: Mother/Baby  Anesthesia Type:Epidural  Level of Consciousness: awake and patient cooperative  Airway and Oxygen Therapy: Patient Spontanous Breathing  Post-op Pain: none  Post-op Assessment: Patient's Cardiovascular Status Stable, Respiratory Function Stable, Patent Airway, No signs of Nausea or vomiting, Adequate PO intake, Pain level controlled, No headache, No backache, No residual numbness and No residual motor weakness  Post-op Vital Signs: Reviewed and stable  Complications: No apparent anesthesia complications

## 2013-12-29 NOTE — Progress Notes (Signed)
Postpartum Note Day # 1  S:  Patient resting comfortable in bed.  Pain controlled.  Tolerating general diet. + flatus, no BM.  Lochia moderate.  Ambulating without difficulty.  Foley in place to be removed this am.  She denies n/v/f/c, SOB, or CP.  Pt plans on breastfeeding.  O: Temp:  [97.8 F (36.6 C)-99.4 F (37.4 C)] 98.4 F (36.9 C) (04/03 0800) Pulse Rate:  [58-113] 75 (04/03 0800) Resp:  [16-29] 16 (04/03 0800) BP: (81-144)/(50-87) 121/62 mmHg (04/03 0800) SpO2:  [79 %-100 %] 97 % (04/03 0800) UOP:1300cc/8hr  Gen: A&Ox3, NAD CV: RRR, no MRG Resp: CTAB Abdomen: soft, NT, ND, BS quiet Uterus: firm, non-tender, below umbilicus Incision: c/d/i, bandage on Ext: No edema, no calf tenderness bilaterally  Labs:  Recent Labs  12/28/13 0735 12/29/13 0609  HGB 12.4 11.1*    A/P: Pt is a 33 y.o. G2P1011 s/p primary C-section due to arrest of descent and NRFHT  POD #1  - Pain well controlled -GU: UOP is adequate, foley to be removed this am -GI: Tolerating general diet -Activity: encouraged sitting up to chair and ambulation as tolerated -Prophylaxis: SCDs while in bed, early ambulation -Labs: stable as above Continue routine postoperative care  Myna HidalgoJennifer Jianni Batten, DO [phone] (pager) [phone] (office)

## 2013-12-29 NOTE — Lactation Note (Signed)
This note was copied from the chart of Jacqueline Lynch. Lactation Consultation Note  Patient Name: Jacqueline Tod Persiadile Tedder WUJWJ'XToday's Date: 12/29/2013 Reason for consult: Initial assessment Baby 12 hours old. Mom nursing in an awkward position, assisted to place baby in football hold, mom reports more comfortable, and baby latched well with #20 nipple shield that had been fitted overnight.  Mom very sleepy, but taught to hand express. No colostrum noted. Reviewed basics. Enc mom to feed based on cues. [facility] given [facility], aware of outpatient services and [facility], and community resources. Enc to call out for assistance as needed.  Maternal Data Formula Feeding for Exclusion: No Infant to breast within first hour of birth: Yes Has patient been taught Hand Expression?: Yes Does the patient have breastfeeding experience prior to this delivery?: No  Feeding Feeding Type: Breast Fed Length of feed: 15 min  LATCH Score/Interventions Latch: Grasps breast easily, tongue down, lips flanged, rhythmical sucking. Intervention(s): Skin to skin;Teach feeding cues;Waking techniques Intervention(s): Adjust position;Assist with latch;Breast compression  Audible Swallowing: None Intervention(s): Skin to skin  Type of Nipple: Inverted Intervention(s): Reverse pressure;Shells;Hand pump  Comfort (Breast/Nipple): Soft / non-tender     Hold (Positioning): Assistance needed to correctly position infant at breast and maintain latch. Intervention(s): Breastfeeding basics reviewed;Support Pillows;Position options  LATCH Score: 5  Lactation Tools Discussed/Used Tools: Nipple Shields Nipple shield size: 20   Consult Status Consult Status: Follow-up Follow-up type: In-patient    Geralynn OchsWILLIARD, India Jolin 12/29/2013, 9:24 AM

## 2013-12-29 NOTE — Anesthesia Postprocedure Evaluation (Signed)
  Anesthesia Post-op Note  Patient: Jacqueline Lynch  Procedure(s) Performed: Procedure(s): Primary Cesarean Section Delivery Baby Boy @ 2109, Apgars 8/9 (N/A)  Patient Location: Mother/Baby  Anesthesia Type:Epidural  Level of Consciousness: awake, alert  and oriented  Airway and Oxygen Therapy: Patient Spontanous Breathing  Post-op Pain: none  Post-op Assessment: Post-op Vital signs reviewed, Patient's Cardiovascular Status Stable, Respiratory Function Stable, No headache, No backache, No residual numbness and No residual motor weakness  Post-op Vital Signs: Reviewed and stable  Complications: No apparent anesthesia complications

## 2013-12-29 NOTE — Addendum Note (Signed)
Addendum created 12/29/13 1501 by Shanon PayorSuzanne M Ott Zimmerle, CRNA   Modules edited: Notes Section   Notes Section:  File: [id number]

## 2013-12-30 DIAGNOSIS — Z98891 History of uterine scar from previous surgery: Secondary | ICD-10-CM

## 2013-12-30 MED ORDER — TETANUS-DIPHTH-ACELL PERTUSSIS 5-2.5-18.5 LF-MCG/0.5 IM SUSP
0.5000 mL | Freq: Once | INTRAMUSCULAR | Status: AC
  Administered 2013-12-31: 0.5 mL via INTRAMUSCULAR
  Filled 2013-12-30: qty 0.5

## 2013-12-30 NOTE — Progress Notes (Signed)
Postop Note Day # 2  S:  Patient resting comfortable in bed.  Pain controlled.  Tolerating general diet. + flatus, no BM.  Lochia minimal.  Ambulating and voiding without difficulty.  .  She denies n/v/f/c, SOB, or CP.   O: Temp:  [97.9 F (36.6 C)-98.6 F (37 C)] 98.6 F (37 C) (04/04 0530) Pulse Rate:  [80-82] 80 (04/04 0530) Resp:  [16] 16 (04/04 0530) BP: (96-104)/(62-66) 104/66 mmHg (04/04 0530) SpO2:  [97 %] 97 % (04/03 1236)  Gen: A&Ox3, NAD CV: RRR, no MRG Resp: CTAB Abdomen: soft, NT, ND, +BS Uterus: firm, non-tender, below umbilicus Incision: c/d/i, bandage on Ext: 1+ pitting pedal edema, no calf tenderness bilaterally  Labs:   Recent Labs  12/28/13 0735 12/29/13 0609  HGB 12.4 11.1*    A/P: Pt is a 33 y.o. G2P1011 s/p primary C-section due to arrest of descent and NRFHT  POD #2  - Pain well controlled -GU: Voiding freely -GI: Tolerating general diet -Activity: encouraged sitting up to chair and ambulation as tolerated -Prophylaxis: SCDs while in bed, early ambulation -Labs: stable as above Continue routine postoperative care Female circ to be performed once baby urinates  Myna HidalgoJennifer Shirline Kendle, DO [phone] (pager) [phone] (office)

## 2013-12-30 NOTE — Lactation Note (Signed)
This note was copied from the chart of Boy Tymber [place]. Lactation Consultation Note  Patient Name: Boy Tod Persiadile Menon ZOXWR'UToday's Date: 12/30/2013 Reason for consult: Follow-up assessment;Breast/nipple pain;Difficult latch  Visited with Mom and FOB, baby at 3745 hrs old.  Mom wearing Comfort Gels for very sore and blistered nipples.  Baby using pacifier in crib.  Talked to parents about holding off on pacifier use for 4-6 weeks until breast feeding is fully established.  Assisted with latch.  Mom needed prompting to use manual pump prior to applying the nipple shield.  Baby became rhythmic and swallowing heard.  Nipple shield full of colostrum at end of first side.  Reviewed importance of good support using pillows, and firm support of breast.  Encouraged alternate breast compression during feeding to increase milk transfer.  Mom felt latch more comfortable following manual pumping prior to latch.  Basics reviewed.  To call for help prn, and follow up tomorrow.  Talked about OP lactation appointment following discharge.  Parents agreeable.   Judee ClaraSmith, Lavra Imler E 12/30/2013, 6:54 PM

## 2013-12-31 ENCOUNTER — Encounter (HOSPITAL_COMMUNITY)
Admission: RE | Admit: 2013-12-31 | Discharge: 2013-12-31 | Disposition: A | Discharge status: Home / Self Care | Payer: BC Managed Care – PPO | Source: Ambulatory Visit | Attending: Obstetrics and Gynecology | Admitting: Obstetrics and Gynecology

## 2013-12-31 DIAGNOSIS — O923 Agalactia: Secondary | ICD-10-CM | POA: Insufficient documentation

## 2013-12-31 MED ORDER — SENNOSIDES-DOCUSATE SODIUM 8.6-50 MG PO TABS: 2.0000 | ORAL_TABLET | ORAL | Status: DC

## 2013-12-31 MED ORDER — OXYCODONE-ACETAMINOPHEN 5-325 MG PO TABS: 1.0000 | ORAL_TABLET | ORAL | Status: DC | PRN

## 2013-12-31 MED ORDER — IBUPROFEN 600 MG PO TABS: 600.0000 mg | ORAL_TABLET | Freq: Four times a day (QID) | ORAL | Status: DC

## 2013-12-31 NOTE — Lactation Note (Addendum)
This note was copied from the chart of Jacqueline Arisha [place]. Lactation Consultation Note  Patient Name: Jacqueline Lynch ZOXWR'UToday's Date: 12/31/2013 Reason for consult: Follow-up assessment LC has seen mom x2 today , visit in am and then this afternoon post circ. Baby awake and showing feeding cues. Mom applied #20 NS on her own and latched  By herself but was feeling discomfort , LC assisted with adjusting with pillow support and breast  compressions and per mom more comfortable. Baby fed 20 mins, increased volume of milk in nipple shield  Noted when baby released. Nipple more erect. LC reviewed the lactation plan and the importance of feeding the  Baby every 3 hours and also the importance of consistent pumping after breast feeding due to the use of the nipple shield. Mom and dad aware the 1st 2 weeks of breast feeding are important for establishing and protecting milk supply. LC recommended At least post pump 10 -15 mins after 6 feedings and when needed to prevent engorgement. LC reviewed prevention of further soreness By prepumping after breast massage , hand express, prepump both breast together for 5 mins so the nipple shield will fit better and the nipple  Will be more erect. Then instill EBM with curved tip syringe into he top of the nipple shield for an appetizer .  [facility]. Apt. Sheet given to mom. Mom also rented a DEBP with instructions by the [facility].  Both mom and dad seem to have a good understanding of the [facility]. Mom does well applying the nipple shield.    Maternal Data    Feeding Feeding Type: Breast Fed Length of feed: 20 min (increased milk in the nipple shield noted , and baby fed 20 )  LATCH Score/Interventions Latch: Grasps breast easily, tongue down, lips flanged, rhythmical sucking. Intervention(s): Skin to skin;Teach feeding cues;Waking techniques Intervention(s): Adjust position;Assist with latch;Breast  massage;Breast compression  Audible Swallowing: Spontaneous and intermittent  Type of Nipple: Everted at rest and after stimulation  Comfort (Breast/Nipple): Filling, red/small blisters or bruises, mild/mod discomfort  Problem noted: Filling  Hold (Positioning): Assistance needed to correctly position infant at breast and maintain latch. Intervention(s): Breastfeeding basics reviewed;Support Pillows;Position options;Skin to skin  LATCH Score: 8  Lactation Tools Discussed/Used Tools: Shells;Nipple Shields;Pump;Comfort gels Nipple shield size: 20 Breast pump type: Double-Electric Breast Pump (rented a DEBP )   Consult Status Consult Status: Follow-up Date: 01/04/14 (1030 am ) Follow-up type: Out-patient    Kathrin Greathouseorio, Veora Fonte Ann 12/31/2013, 2:55 PM

## 2013-12-31 NOTE — Discharge Instructions (Signed)
Cesarean Delivery °Care After °Refer to this sheet in the next few weeks. These instructions provide you with information on caring for yourself after your procedure. Your health care provider may also give you specific instructions. Your treatment has been planned according to current medical practices, but problems sometimes occur. Call your health care provider if you have any problems or questions after you go home. °HOME CARE INSTRUCTIONS  °· Only take over-the-counter or prescription medications as directed by your health care provider. °· Do not drink alcohol, especially if you are breastfeeding or taking medication to relieve pain. °· Do not chew or smoke tobacco. °· Continue to use good perineal care. Good perineal care includes: °· Wiping your perineum from front to back. °· Keeping your perineum clean. °· Check your surgical cut (incision) daily for increased redness, drainage, swelling, or separation of skin. °· Clean your incision gently with soap and water every day, and then pat it dry. If your health care provider says it is OK, leave the incision uncovered. Use a bandage (dressing) if the incision is draining fluid or appears irritated. If the adhesive strips across the incision do not fall off within 7 days, carefully peel them off. °· Hug a pillow when coughing or sneezing until your incision is healed. This helps to relieve pain. °· Do not use tampons or douche until your health care provider says it is okay. °· Shower, wash your hair, and take tub baths as directed by your health care provider. °· Wear a well-fitting bra that provides breast support. °· Limit wearing support panties or control-top hose. °· Drink enough fluids to keep your urine clear or pale yellow. °· Eat high-fiber foods such as whole grain cereals and breads, brown rice, beans, and fresh fruits and vegetables every day. These foods may help prevent or relieve constipation. °· Resume activities such as climbing stairs,  driving, lifting, exercising, or traveling as directed by your health care provider. °· Talk to your health care provider about resuming sexual activities. This is dependent upon your risk of infection, your rate of healing, and your comfort and desire to resume sexual activity. °· Try to have someone help you with your household activities and your newborn for at least a few days after you leave the hospital. °· Rest as much as possible. Try to rest or take a nap when your newborn is sleeping. °· Increase your activities gradually. °· Keep all of your scheduled postpartum appointments. It is very important to keep your scheduled follow-up appointments. At these appointments, your health care provider will be checking to make sure that you are healing physically and emotionally. °SEEK MEDICAL CARE IF:  °· You are passing large clots from your vagina. Save any clots to show your health care provider. °· You have a foul smelling discharge from your vagina. °· You have trouble urinating. °· You are urinating frequently. °· You have pain when you urinate. °· You have a change in your bowel movements. °· You have increasing redness, pain, or swelling near your incision. °· You have pus draining from your incision. °· Your incision is separating. °· You have painful, hard, or reddened breasts. °· You have a severe headache. °· You have blurred vision or see spots. °· You feel sad or depressed. °· You have thoughts of hurting yourself or your newborn. °· You have questions about your care, the care of your newborn, or medications. °· You are dizzy or lightheaded. °· You have a rash. °· You   have pain, redness, or swelling at the site of the removed intravenous access (IV) tube. °· You have nausea or vomiting. °· You stopped breastfeeding and have not had a menstrual period within 12 weeks of stopping. °· You are not breastfeeding and have not had a menstrual period within 12 weeks of delivery. °· You have a fever. °SEEK  IMMEDIATE MEDICAL CARE IF: °· You have persistent pain. °· You have chest pain. °· You have shortness of breath. °· You faint. °· You have leg pain. °· You have stomach pain. °· Your vaginal bleeding saturates 2 or more sanitary pads in 1 hour. °MAKE SURE YOU:  °· Understand these instructions. °· Will watch your condition. °· Will get help right away if you are not doing well or get worse. °Document Released: 06/06/2002 Document Revised: 05/17/2013 Document Reviewed: 05/11/2012 °Exit[employer]® Patient Information ©2014 [employer]. ° ° ° °

## 2013-12-31 NOTE — Progress Notes (Signed)
Postop Note Day # 3  S:  Patient resting comfortable in bed.  Pain controlled.  Tolerating general diet. + flatus, no BM.  Lochia minimal.  Ambulating and voiding without difficulty.  She denies n/v/f/c, SOB, or CP.  Pt breastfeeding.  O: Temp:  [97.9 F (36.6 C)-98.1 F (36.7 C)] 97.9 F (36.6 C) (04/05 0609) Pulse Rate:  [71-73] 71 (04/05 0609) Resp:  [18] 18 (04/05 0609) BP: (109-110)/(63-70) 110/70 mmHg (04/05 0609) SpO2:  [98 %] 98 % (04/05 0609)  Gen: A&Ox3, NAD CV: RRR, no MRG Resp: CTAB Abdomen: soft, NT, ND, +BS Uterus: firm, non-tender, below umbilicus Incision: c/d/i, bandage on Ext: 1+ pitting pedal edema-improving, no calf tenderness bilaterally  Labs:  CBC    Component Value Date/Time   WBC 13.0* 12/29/2013 0609   RBC 3.60* 12/29/2013 0609   HGB 11.1* 12/29/2013 0609   HCT 32.8* 12/29/2013 0609   PLT 155 12/29/2013 0609   MCV 91.1 12/29/2013 0609   MCH 30.8 12/29/2013 0609   MCHC 33.8 12/29/2013 0609   RDW 12.9 12/29/2013 0609    A/P: Pt is a 33 y.o. G2P1011 s/p primary C-section due to arrest of descent and NRFHT  POD #3  - Pain well controlled -GU: Voiding freely -GI: Tolerating general diet -Activity: encouraged sitting up to chair and ambulation as tolerated -Prophylaxis: SCDs while in bed, early ambulation -Labs: stable as above Continue routine postoperative care Female circ on hold due to poor feeding/voids, will await recommendation from PEDS  Pt is meeting postoperative milestones appropriately, mother to be discharged today.  Baby's discharge will depend on pediatrician.  Myna HidalgoJennifer Mazin Emma, DO [phone] (pager) [phone] (office)

## 2014-01-03 NOTE — Discharge Summary (Signed)
Obstetric Discharge Summary Reason for Admission: induction of labor Date of Admission: 12/28/13 Date of Discharge: 12/31/13 Prenatal Procedures: none Intrapartum Procedures: cesarean: low cervical, transverse Postpartum Procedures: none Complications-Operative and Postpartum: none Hemoglobin  Date Value Ref Range Status  12/29/2013 11.1* 12.0 - 15.0 g/dL Final     HCT  Date Value Ref Range Status  12/29/2013 32.8* 36.0 - 46.0 % Final    Physical Exam:  General: alert and no distress Lochia: appropriate Uterine Fundus: firm Incision: healing well DVT Evaluation: No evidence of DVT seen on physical exam.  Hospital Course: 33yo G2P0010 @ 67w6dadmitted for postdates induction of labor.  Pregnancy uncomplicated.  On arrival pt was noted to be 2.5cm dilated.  Induction was started with Pitocin followed by AROM and placement of IUPC.  She received an epidural for pain management.  The patient progressed to complete dilation.  Despite pushing for over 2 hours the patient required a primary C-section due to arrest of descent and non-reassuring fetal well-being.  C-section performed by Dr. TChristophe Louis  The patient's post-operative course was uncomplicated as she met all milestones appropriately and was discharged home in stable condition on POD#3.  Discharge Diagnoses: Term Pregnancy-delivered, Failed induction and primary C-section  Discharge Information: Date: 01/03/2014 Activity: pelvic rest Diet: routine Medications: PNV, Ibuprofen and Percocet Condition: stable Instructions: refer to practice specific booklet Discharge to: home Follow-up Information   Follow up with CCatha Brow, MD In 2 weeks. (For wound re-check)    Specialty:  Obstetrics and Gynecology   Contact information:   [address], [phone]       Newborn Data: Live born female  Birth Weight: 6 lb 12.6 oz (3080 g) APGAR: 8, 9  Home with mother.  JAnnalee Genta4/04/2014,  6:38 AM

## 2014-01-04 ENCOUNTER — Ambulatory Visit (HOSPITAL_COMMUNITY)
Admission: RE | Admit: 2014-01-04 | Discharge: 2014-01-04 | Disposition: A | Discharge status: Home / Self Care | Payer: BC Managed Care – PPO | Source: Ambulatory Visit | Attending: Obstetrics and Gynecology | Admitting: Obstetrics and Gynecology

## 2014-01-04 NOTE — Lactation Note (Addendum)
Adult Lactation Consultation Outpatient Visit Note  Patient Name: Jacqueline Lynch Date of Birth: 1980/10/09 Gestational Age at Delivery: Unknown Type of Delivery: C/section  BW - 6-12 oz  D/C weight - 6-3 oz  1st Dr. 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   Breastfeeding History: Day of D/C baby was latching well with #20 Nipple shield and mom was able to apply it independently.  Milk was in , and mom rented a DEBP day of D/C with a Lactation plan to feed the baby and post pump due to using a nipple shield. Per mom has pumped after 10 mins after feeding  A few times and when needed if still feeling dull . Baby " Jacqueline Lynch " feeds every 2-3 hours  And is able to latch both breast with nipple shield. Nipples have been on and off sore , the comfort gels are helping per mom. Frequency of Breastfeeding: every 2-3 hours  Length of Feeding: 15 -20 mins  Voids: >6  Stools: > 6 yellowish seedy stools   Supplementing / Method: per mom no supplementing needed, has just been breast feeding.  Pumping:  Type of Pump: DEBP Symphony ( rented from [facility] ) , until the insurance plan DEBP comes in .    Frequency: 1-2 times and if necessary .   Volume:    Comments: using the #24 Flange . At this consult had mom pre pump with a smaller flange #21 and is made the base of the nipple more elastic                      Which is a positive for moms semi compressible areolas . LC had mom do the pre-pumping to demo to mom the nipple shield would fit better.                       LC had the baby feed with the #20 NS , the 1st feeding on the right side and noted after releasing the base of the nipple was tight, Changed to #24 NS                        For the 2nd re-latch same breast and noted more milk transfer in the nipple shield. Per mom some intermittent  discomfort with both size NS #20 and #24.                         Right nipple appeared more erect after pumping with the #21 flange and after using the #24 NS . The baby opens wide for a deep latch.     Consultation Evaluation: LC noted Baby Jacqueline Lynch to be awake and alert , color pink , moist mucous membranes, no diaper rash noted. Baby calm ,  Mom appears rested , and per mom breast feeding has been going well with the nipple shield . LC assessed both breast prior to feeding , both breast full, no engorgement noted ,  Some full  tender nodules noted in the inner aspects of both breast, not the lateral aspects. Per mom feeding mostly in the football position both breast.   Last fed at 915 am for 15 mins per mom  Initial Feeding Assessment:- right breast , football  Pre-feed Weight:6- 9.o oz   2977 g  Post-feed Weight: 6-10.2 oz 3010 g  Amount Transferred: 33 ml  Comments: LC had mom prep breast and pre- pump 5 mins to make the nipple more erect with #21 Flange , used the #20 NS , good fit to start. Baby Jacqueline Lynch fed for 15 mins in a consistent swallowing pattern , increased with breast compressions. Baby Jacqueline Lynch became non-nutritive , and had mom release suction. Burped the baby and weighed. Baby Jacqueline Lynch rooting and still acting hungry so relatched on the same breast , still somewhat full in the cross cradle. ( see below )    Additional Feeding Assessment: re-latched right breast , cross cradle  Pre-feed Weight: 6-9.1 oz  2978 g  Post-feed Weight: 6-10.2 oz 3010 g  Amount Transferred: 32 ml   Comments: changed the Nipple shield size to #24 , due to Jacqueline Lynch opening is mouth wider and noting milk coming out of the top of the nipple shield with previous latch.                       When the nipple shield 1st applied a[ppears to be boarder line to big ,but after feeding noted the #24 NS to accommodate the base of the nipple and areola                       Allowing for and increase let down and  more milk noted in the nipple shield.  Total Breast milk Transferred this Visit:  65 ml  Total Supplement Given: none  Post pump for 15 -20 mins - 48 ml ( mom took milk with her )   Lactation Plan of Care - Praised mom for her efforts breast feeding and caring for her baby , also to dad for being so supportive                                           - Mom - rest . Naps , plenty fluids , esp. Water , nutritious snacks and meals                                           - feedings- Every 2 1/2 -3 hours and with feeding cues.                                                             - grow spurts , 7-10 days , 3 weeks , 6 weeks ) cluster feedings are normal                                            - Average length feedings - 10 -20 mins , watch for hanging out at the breast - non-nutritive feedings                                           -  Steps for latching - including breast compressions , important to soften 1st breast prior to offering 2nd breast                                            - Due to using nipple shield post pump after 3-4 feeding a day for 10 mins when the baby isn't cluster feeding , save milk .  Comfort gels for soreness , will reassess at the next consult.    Follow-Up- With Lactation - 4/17 at 4pm                    - F/U with Dr. Nash Lynch ( / date ) , [facility] suggested calling Dr. Isidore Lynch for 2 week check up )       Jacqueline SprangMargaret Ann Madie Lynch 01/04/2014, 1:06 PM

## 2014-01-12 ENCOUNTER — Ambulatory Visit (HOSPITAL_COMMUNITY)
Admission: RE | Admit: 2014-01-12 | Discharge: 2014-01-12 | Disposition: A | Discharge status: Home / Self Care | Payer: BC Managed Care – PPO | Source: Ambulatory Visit | Attending: Obstetrics and Gynecology | Admitting: Obstetrics and Gynecology

## 2014-01-12 NOTE — Lactation Note (Addendum)
lAdult Lactation Consultation Outpatient Visit Note  Patient Name: Jacqueline Lynch Date of Birth: March 29, 1981 Gestational Age at Delivery: Unknown Type of Delivery: C/section  Jacqueline Lynch is 202 weeks old and gaining nicely. Praised mom for her consistent feedings and pumping . Per mom Smart start weight in the last few days - 7-0 oz  Up from 6-9 oz last weeks LC consult. Today's weight - 7-2. 8 oz , 3254 g  Reason for today's visit - weight check and assessment of sore nipples , and check on milk supply Breastfeeding History: Sore ness had improved with using the comfort gels and since I haven't used them for 2 days , the soreness is back. LC gave mom another pack at consult. Frequency of Breastfeeding: every 2 1/2 -3 hours  Length of Feeding: for 15 - 20 mins , and sometimes only feeds one breast ,  and the next feeding feed him on the other breast  Voids: >6 soaking wets  Stools: 6 yellowish stools   Supplementing / Method: per mom exclusively breast feeding  Pumping:  Type of Pump: DEBP Medela    Frequency: 2-3 x's per day   Volume:  Post pumping 2-3 oz after Jacqueline feeds  Comments: Mom brought back DEBP Rental , per mom received DEBP today and had used it x1 .     Consultation Evaluation: Both breast full , but no engorgement noted or plugged ducts . Nipples healthy pink , no breakdown noted.                                             Mom able to apply nipple shield well. Jacqueline appears heathy and more filled out with increase weight , color pink                                              and well hydrated. No diaper rash noted. LC assessed breast tissue , no signs of yeast , nipples appear healthy pink.                                              The base of the nipple and areola still semi compressible but have improved.   Last feeding prior to consult - 250 p for 10 mins   Initial Feeding Assessment: Pre-feed Weight: 7-2.8 oz 3254g  Post-feed Weight: 7-4.6 oz 3306 g  Amount  Transferred: 51 ml  Comments: Jacqueline awake and hungry after weight check and showing feeding cues. Mom applied the #20 NS and was complaining of discomfort.                      LC had mom switch to #24 NS ( shield's she brought from home) and initially mom said she still feels some discomfort but improved once Jacqueline                       Was in a consistent swallowing pattern, increased with breast compressions. Jacqueline fed for 15 mins and transferred 51 ml and was content. Did not latch the 2nd breast .  Nipple more erect and areola more compressible than last week. Reassured mom the tissue will continue to improve with consistent feedings and pumping.    Total Breast milk Transferred this Visit: 51 ml  Total Supplement Given: none   Lactation Plan of Care - Praised mom for her breast feeding efforts and dad for his support                                          - Sore nipple prevention and tx , LC recommended - breast massage , hand express off fullness so the                                           nipple shield fits better , also using the #24 NS on the right and the #20 NS on the left , if to tight switch to #24 NS.                                          - For the left due to being semi inverted , pre pumping with hand pump probably would help. Comfort gels for comfort after feedings.    Follow-Up- LC encouraged mom to come to the [facility] for support and weight checks weekly due to using the nipple shield                       Or if smart start will come out weekly for weight checks up to 6 weeks ok also. Stressed weekly weight checks Per mom May 15 th with Dr. Marlowe SaxQuilian for weight and check up       Jacqueline Lynch 01/12/2014, 4:55 PM

## 2014-05-03 ENCOUNTER — Other Ambulatory Visit: Payer: Self-pay | Admitting: Obstetrics and Gynecology

## 2014-05-03 ENCOUNTER — Other Ambulatory Visit (HOSPITAL_COMMUNITY)
Admission: RE | Admit: 2014-05-03 | Discharge: 2014-05-03 | Disposition: A | Discharge status: Home / Self Care | Payer: BC Managed Care – PPO | Source: Ambulatory Visit | Attending: Obstetrics and Gynecology | Admitting: Obstetrics and Gynecology

## 2014-05-03 DIAGNOSIS — Z1151 Encounter for screening for human papillomavirus (HPV): Secondary | ICD-10-CM | POA: Insufficient documentation

## 2014-05-03 DIAGNOSIS — Z01419 Encounter for gynecological examination (general) (routine) without abnormal findings: Secondary | ICD-10-CM | POA: Insufficient documentation

## 2014-05-07 LAB — CYTOLOGY - PAP

## 2014-07-30 ENCOUNTER — Encounter (HOSPITAL_COMMUNITY): Payer: Self-pay | Admitting: Obstetrics and Gynecology

## 2015-12-13 IMAGING — US US OB COMP +14 WK
1 series · 12 of 28 positions shown · non-contrast
Comparison: none

[Series 1: us ob comp +14 wk · 42 acquisitions, 12 frames shown]
[im 2/42]
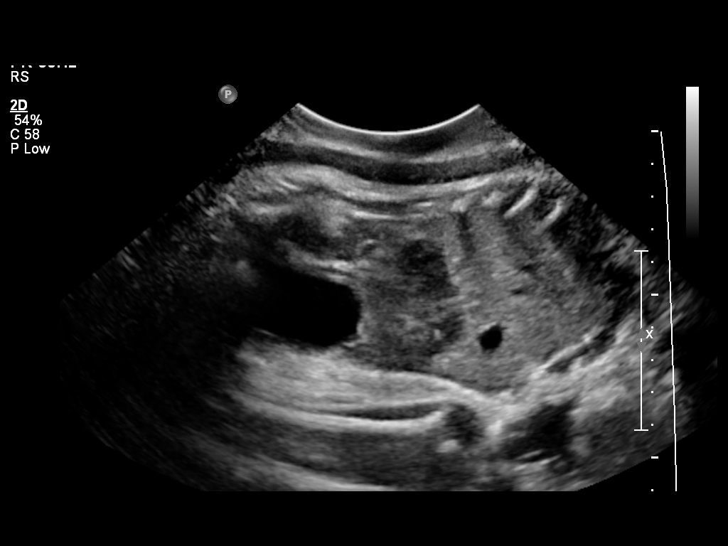
[im 5/42]
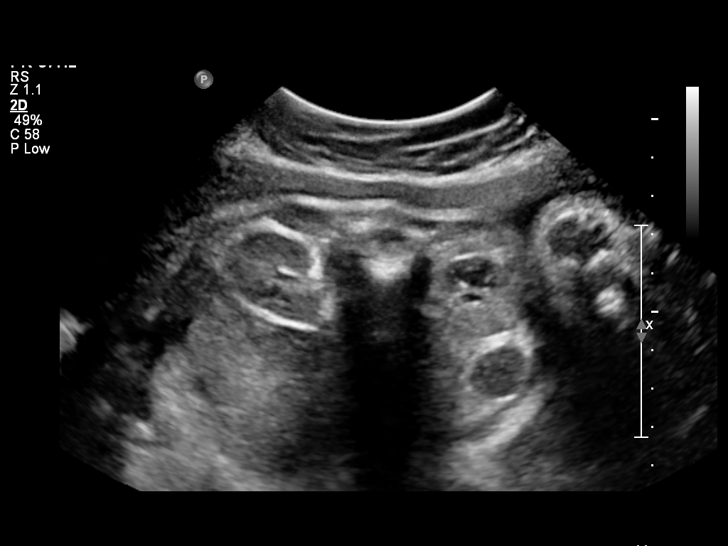
[im 8/42]
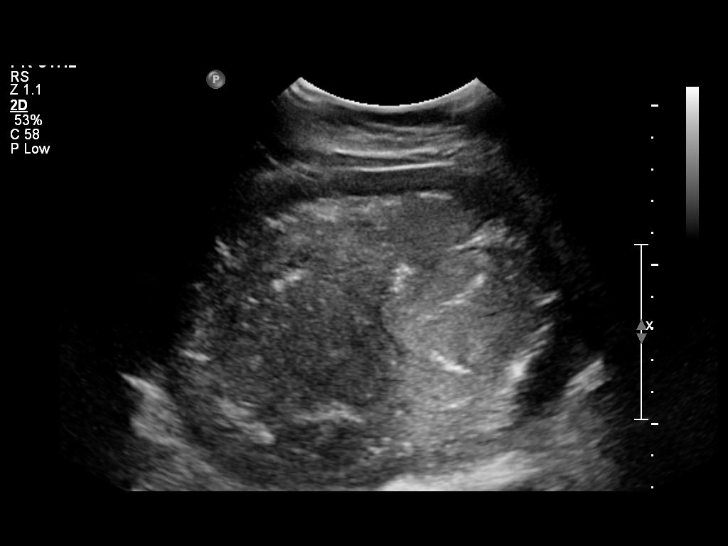
[im 13/42]
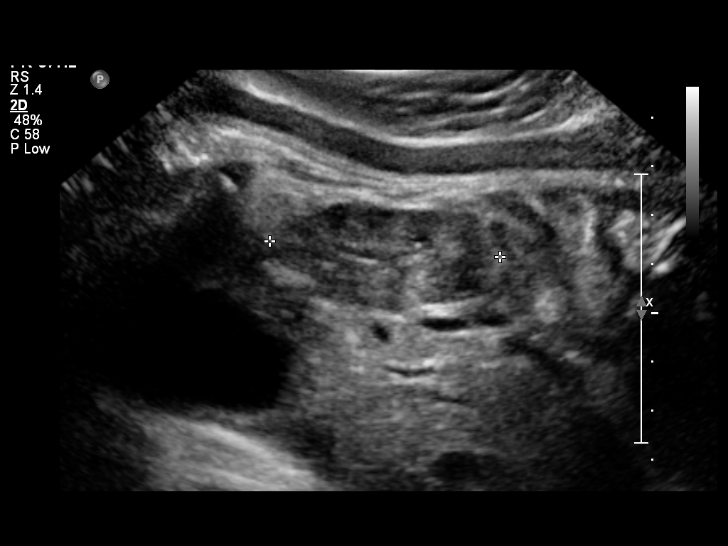
[im 16/42]
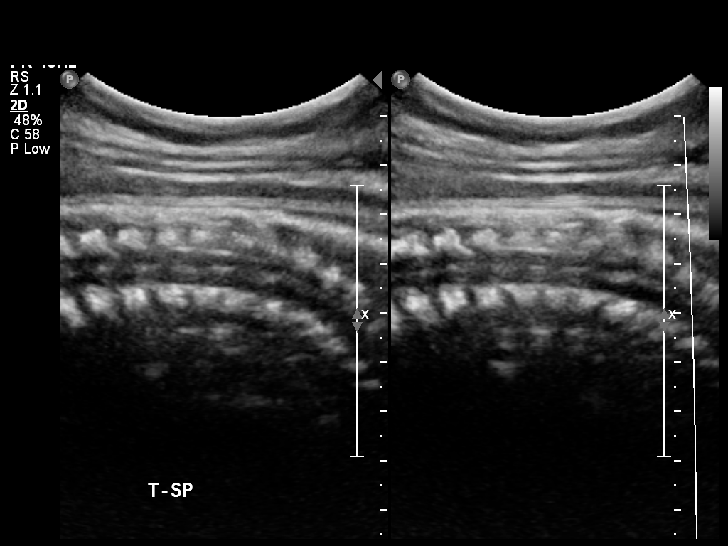
[im 19/42]
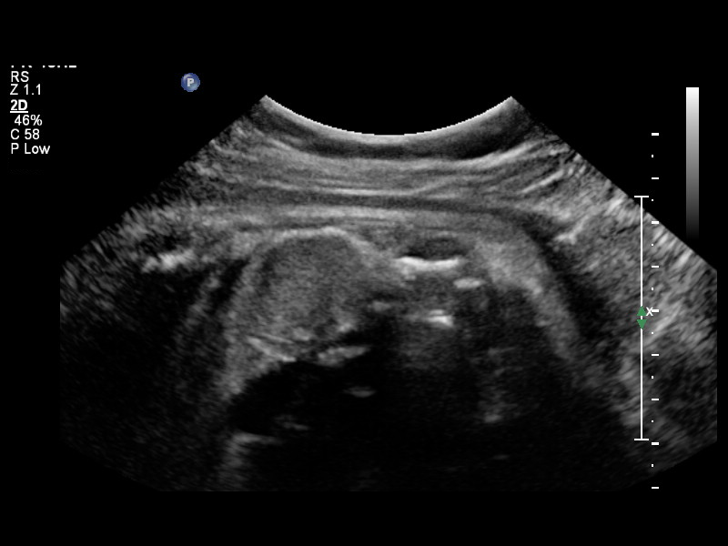
[im 23/42]
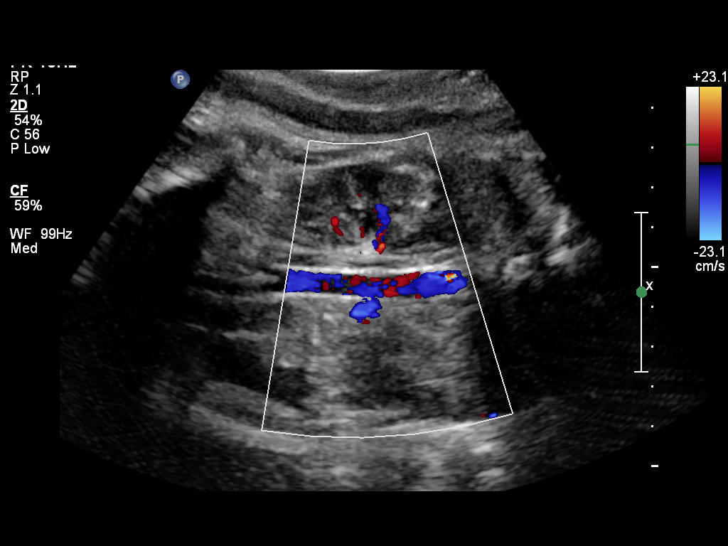
[im 26/42]
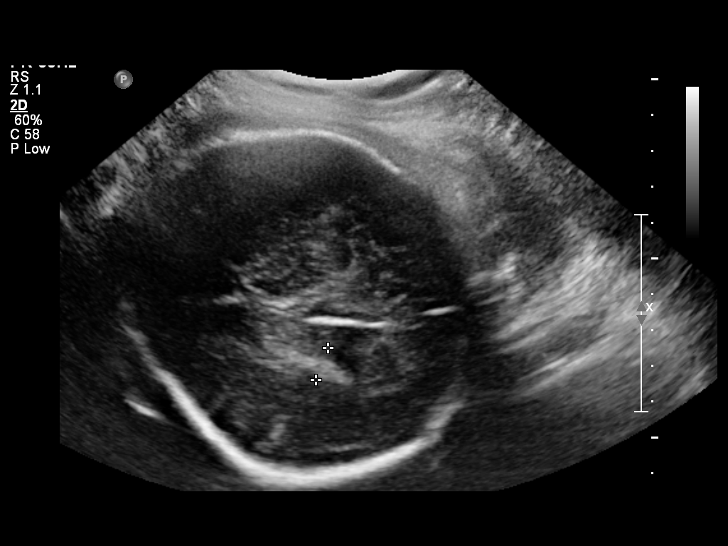
[im 29/42]
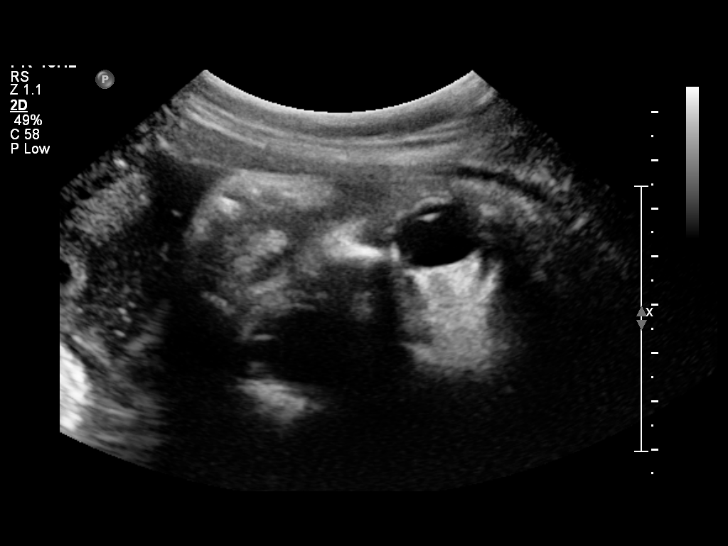
[im 34/42]
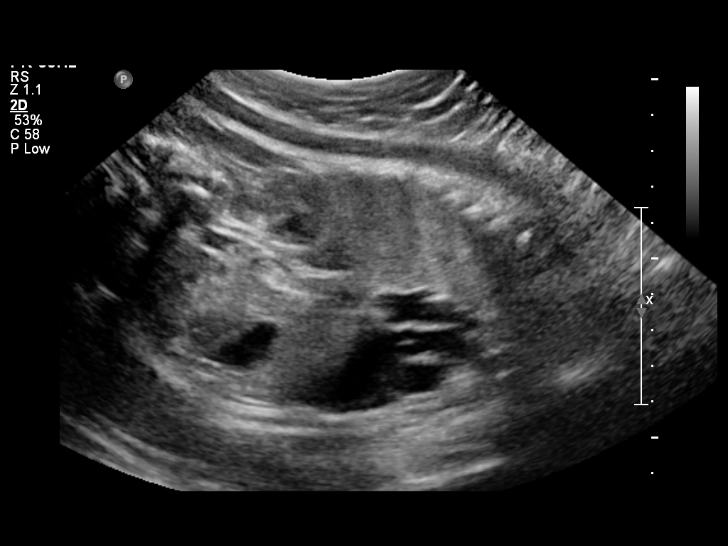
[im 37/42]
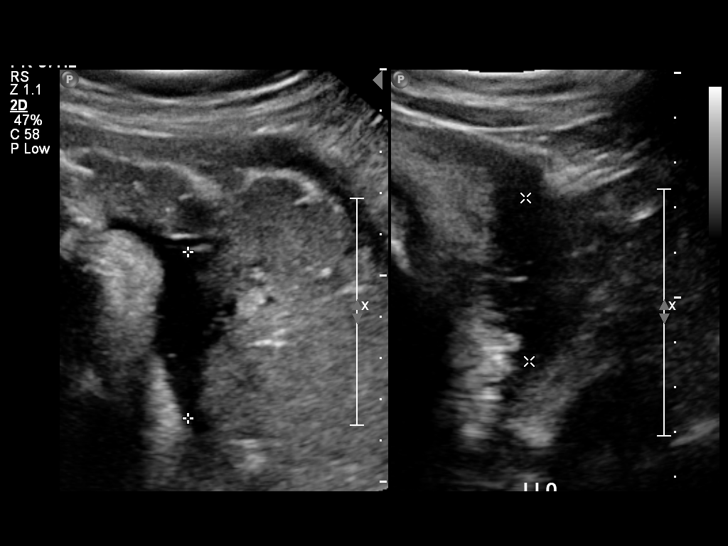
[im 40/42]
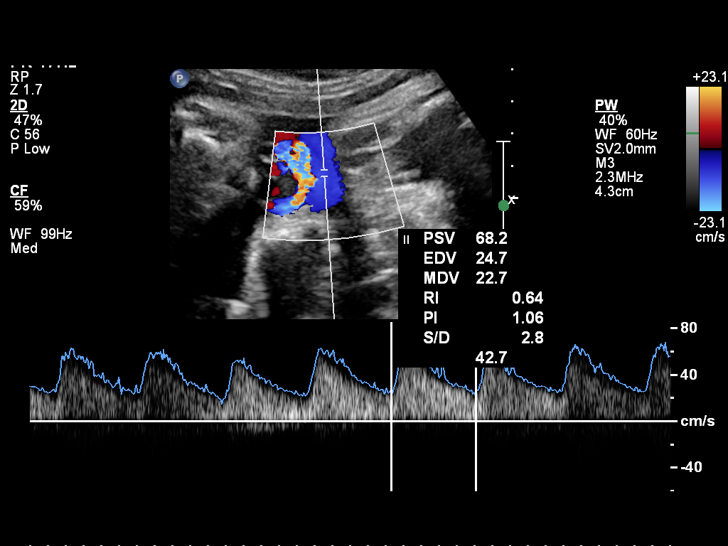

[12 of 28 positions shown; findings below may reference images not displayed]

OBSTETRICS REPORT

Service(s) Provided

 US OB COMP + 14 WK                                    76805.1
 US UA CORD DOPPLER                                    76820.0
Indications

 Basic anatomic survey
 Size less than dates (Small for gestational [AGE]
 FGR)
Fetal Evaluation

 Num Of Fetuses:    1
 Fetal Heart Rate:  132                          bpm
 Cardiac Activity:  Observed
 Presentation:      Cephalic
 Placenta:          Posterior, above cervical
                    os
 P. Cord            Not well visualized
 Insertion:

 Amniotic Fluid
 AFI FV:      Subjectively within normal limits
 AFI Sum:     14.11   cm       53  %Tile     Larg Pckt:    4.43  cm
 RUQ:   2.07    cm   RLQ:    4.43   cm    LUQ:   4       cm   LLQ:    3.61   cm
Biometry

 BPD:     91.6  mm     G. Age:  37w 1d                CI:         80.7   70 - 86
 OFD:    113.5  mm                                    FL/HC:      21.0   20.9 -

 HC:     330.9  mm     G. Age:  37w 5d       28  %    HC/AC:      1.05   0.92 -

 AC:     314.3  mm     G. Age:  35w 2d        9  %    FL/BPD:     75.8   71 - 87
 FL:      69.4  mm     G. Age:  35w 4d        9  %    FL/AC:      22.1   20 - 24

 Est. FW:    6144  gm      6 lb 2 oz     32  %
Gestational Age

 Clinical EDD:  37w 5d                                        EDD:   12/23/13
 U/S Today:     36w 3d                                        EDD:   01/01/14
 Best:          37w 5d     Det. By:  Clinical EDD             EDD:   12/23/13
Anatomy
 Cranium:          Appears normal         LVOT:             Appears normal
 Fetal Cavum:      Appears normal         Diaphragm:        Appears normal
 Ventricles:       Appears normal         Stomach:          Appears normal
 Choroid Plexus:   Not well visualized    Abdomen:          Not well visualized
 Cerebellum:       Not well visualized    Abdominal Wall:   Not well visualized
 Posterior Fossa:  Not well visualized    Cord Vessels:     Appears normal (3
                                                            vessel cord)
 Nuchal Fold:      Not applicable (>20    Kidneys:          Appear normal
                   wks GA)
 Face:             Orbits appear          Bladder:          Appears normal
                   normal
 Lips:             Appears normal         Spine:            Appears normal
 Heart:            Appears normal         Lower             Not well visualized
                   (4CH, axis, and        Extremities:
                   situs)
 RVOT:             Not well visualized    Upper             Not well visualized
                                          Extremities:

 Other:  Technically difficult due to advanced GA.
Doppler - Fetal Vessels

 Umbilical Artery
 S/D:   2.77           74  %tile
 Umbilical Artery
 Absent DFV:    No     Reverse DFV:    No

Cervix Uterus Adnexa

 Cervix:       Not visualized (advanced GA >34 wks)

 Left Ovary:    Not visualized.
 Right Ovary:   Not visualized.
 Adnexa:     No abnormality visualized.
Impression

 Single IUP at 37 [DATE] weeks
 Fetal growth is appropriate (32nd %tile).  The AC measures
 at the 9th %tile.
 Somewhat limited views of the fetal anatomy were obtained
 due to late gestational age
 No anomalies noted
 Normal amniotic fluid volume
Recommendations

 Follow-up ultrasounds as clinically indicated.

 questions or concerns.
                 Attending Physician, LOJA

## 2017-09-29 ENCOUNTER — Other Ambulatory Visit (HOSPITAL_COMMUNITY)
Admission: RE | Admit: 2017-09-29 | Discharge: 2017-09-29 | Disposition: A | Discharge status: Home / Self Care | Payer: BLUE CROSS/BLUE SHIELD | Source: Ambulatory Visit | Attending: Obstetrics and Gynecology | Admitting: Obstetrics and Gynecology

## 2017-09-29 ENCOUNTER — Other Ambulatory Visit: Payer: Self-pay | Admitting: Obstetrics and Gynecology

## 2017-09-29 DIAGNOSIS — Z113 Encounter for screening for infections with a predominantly sexual mode of transmission: Secondary | ICD-10-CM | POA: Insufficient documentation

## 2017-09-29 DIAGNOSIS — Z124 Encounter for screening for malignant neoplasm of cervix: Secondary | ICD-10-CM | POA: Diagnosis present

## 2017-09-29 DIAGNOSIS — Z01419 Encounter for gynecological examination (general) (routine) without abnormal findings: Secondary | ICD-10-CM | POA: Insufficient documentation

## 2017-10-01 LAB — CYTOLOGY - PAP
Chlamydia: NEGATIVE
Diagnosis: NEGATIVE
HPV: NOT DETECTED
Neisseria Gonorrhea: NEGATIVE

## 2019-07-21 LAB — OB RESULTS CONSOLE HEPATITIS B SURFACE ANTIGEN
Hepatitis B Surface Ag: NEGATIVE
Hepatitis B Surface Ag: NEGATIVE

## 2019-07-21 LAB — OB RESULTS CONSOLE HIV ANTIBODY (ROUTINE TESTING)
HIV: NONREACTIVE
HIV: NONREACTIVE
HIV: NONREACTIVE

## 2019-07-21 LAB — OB RESULTS CONSOLE RUBELLA ANTIBODY, IGM: Rubella: IMMUNE

## 2020-01-22 ENCOUNTER — Other Ambulatory Visit: Payer: Self-pay

## 2020-01-22 ENCOUNTER — Inpatient Hospital Stay (HOSPITAL_COMMUNITY)
Admission: AD | Admit: 2020-01-22 | Discharge: 2020-01-22 | Disposition: A | Discharge status: Home / Self Care | Payer: 59 | Attending: Obstetrics and Gynecology | Admitting: Obstetrics and Gynecology

## 2020-01-22 ENCOUNTER — Encounter (HOSPITAL_COMMUNITY): Payer: Self-pay | Admitting: Obstetrics and Gynecology

## 2020-01-22 DIAGNOSIS — O368130 Decreased fetal movements, third trimester, not applicable or unspecified: Secondary | ICD-10-CM | POA: Diagnosis present

## 2020-01-22 DIAGNOSIS — Z3A34 34 weeks gestation of pregnancy: Secondary | ICD-10-CM | POA: Diagnosis not present

## 2020-01-22 NOTE — MAU Provider Note (Signed)
History     [id number]  Arrival date and time: 01/22/20 9937    Chief Complaint  Patient presents with  . Decreased Fetal Movement     HPI Jacqueline Lynch is a 39 y.o. at [redacted]w[redacted]d who presents for decreased fetal movement.   First noticed less movement yesterday evening Today really noticed lack of movement this morning No loss of fluid No vaginal bleeding Occasional braxton-hicks, no consistent contractions     OB History    Gravida  3   Para  1   Term  1   Preterm      AB  1   Living  1     SAB  1   TAB      Ectopic      Multiple      Live Births  1           Past Medical History:  Diagnosis Date  . Medical history non-contributory     Past Surgical History:  Procedure Laterality Date  . CESAREAN SECTION N/A 12/28/2013   Procedure: Primary Cesarean Section Delivery Baby Boy @ 2109, Apgars 8/9;  Surgeon: Dorien Chihuahua. Richardson Dopp, MD;  Location: [facility];  Service: Obstetrics;  Laterality: N/A;  . NO PAST SURGERIES      Family History  Problem Relation Age of Onset  . Alcohol abuse Neg Hx   . Arthritis Neg Hx   . Asthma Neg Hx   . Birth defects Neg Hx   . Cancer Neg Hx   . COPD Neg Hx   . Depression Neg Hx   . Diabetes Neg Hx   . Drug abuse Neg Hx   . Early death Neg Hx   . Hearing loss Neg Hx   . Heart disease Neg Hx   . Hyperlipidemia Neg Hx   . Hypertension Neg Hx   . Kidney disease Neg Hx   . Learning disabilities Neg Hx   . Mental illness Neg Hx   . Mental retardation Neg Hx   . Miscarriages / Stillbirths Neg Hx   . Stroke Neg Hx   . Vision loss Neg Hx     Social History   Socioeconomic History  . Marital status: Married    Spouse name: Not on file  . Number of children: Not on file  . Years of education: Not on file  . Highest education level: Not on file  Occupational History  . Not on file  Tobacco Use  . Smoking status: Never Smoker  . Smokeless tobacco: Never Used  Substance and Sexual Activity  . Alcohol use: No  . Drug  use: No  . Sexual activity: Yes    Birth control/protection: None  Other Topics Concern  . Not on file  Social History Narrative  . Not on file   Social Determinants of Health   Financial Resource Strain:   . Difficulty of Paying Living Expenses:   Food Insecurity:   . Worried About Programme researcher, broadcasting/film/video in the Last Year:   . Barista in the Last Year:   Transportation Needs:   . Freight forwarder (Medical):   Marland Kitchen Lack of Transportation (Non-Medical):   Physical Activity:   . Days of Exercise per Week:   . Minutes of Exercise per Session:   Stress:   . Feeling of Stress :   Social Connections:   . Frequency of Communication with Friends and Family:   . Frequency of Social Gatherings with Friends and  Family:   . Attends Religious Services:   . Active Member of Clubs or Organizations:   . Attends Archivist Meetings:   Marland Kitchen Marital Status:   Intimate Partner Violence:   . Fear of Current or Ex-Partner:   . Emotionally Abused:   Marland Kitchen Physically Abused:   . Sexually Abused:     No Known Allergies  No current facility-administered medications on file prior to encounter.   Current Outpatient Medications on File Prior to Encounter  Medication Sig Dispense Refill  . Prenatal Vit-Fe Fumarate-FA (PRENATAL MULTIVITAMIN) TABS tablet Take 1 tablet by mouth at bedtime.    Marland Kitchen ibuprofen (ADVIL,MOTRIN) 600 MG tablet Take 1 tablet (600 mg total) by mouth every 6 (six) hours. 30 tablet 0  . oxyCODONE-acetaminophen (PERCOCET/ROXICET) 5-325 MG per tablet Take 1-2 tablets by mouth every 4 (four) hours as needed for severe pain (moderate - severe pain). 30 tablet 0  . senna-docusate (SENOKOT-S) 8.6-50 MG per tablet Take 2 tablets by mouth daily. 30 tablet 0     ROS Pertinent positives and negative per HPI, all others reviewed and negative  Physical Exam   BP 113/74   Pulse 86   Temp 98.2 F (36.8 C) (Oral)   Resp 16   Ht 5\' 4"  (1.626 m)   Wt 76.2 kg   SpO2 100%    BMI 28.85 kg/m   Physical Exam  Cervical Exam    Bedside Ultrasound Not done  FHT Baseline 140, moderate variability, +accels, -decels Toco: none Cat: I  Labs No results found for this or any previous visit (from the past 24 hour(s)).  Imaging No results found.  MAU Course  Procedures Lab Orders  No laboratory test(s) ordered today   No orders of the defined types were placed in this encounter.  Imaging Orders  No imaging studies ordered today    MDM minimal  Assessment and Plan  #DFM Resolved since arrival to MAU 14 clicks while on monitor Reactive NST Counseled on kick counts Routine labor precautions reviewed  #FWB FHT Cat I NST: Reactive  Clarnce Flock

## 2020-01-22 NOTE — Discharge Instructions (Signed)
Fetal Movement Counts Patient Name: ________________________________________________ Patient Due Date: ____________________ What is a fetal movement count?  A fetal movement count is the number of times that you feel your baby move during a certain amount of time. This may also be called a fetal kick count. A fetal movement count is recommended for every pregnant woman. You may be asked to start counting fetal movements as early as week 28 of your pregnancy. Pay attention to when your baby is most active. You may notice your baby's sleep and wake cycles. You may also notice things that make your baby move more. You should do a fetal movement count:  When your baby is normally most active.  At the same time each day. A good time to count movements is while you are resting, after having something to eat and drink. How do I count fetal movements? 1. Find a quiet, comfortable area. Sit, or lie down on your side. 2. Write down the date, the start time and stop time, and the number of movements that you felt between those two times. Take this information with you to your health care visits. 3. Write down your start time when you feel the first movement. 4. Count kicks, flutters, swishes, rolls, and jabs. You should feel at least 10 movements. 5. You may stop counting after you have felt 10 movements, or if you have been counting for 2 hours. Write down the stop time. 6. If you do not feel 10 movements in 2 hours, contact your health care provider for further instructions. Your health care provider may want to do additional tests to assess your baby's well-being. Contact a health care provider if:  You feel fewer than 10 movements in 2 hours.  Your baby is not moving like he or she usually does. Date: ____________ Start time: ____________ Stop time: ____________ Movements: ____________ Date: ____________ Start time: ____________ Stop time: ____________ Movements: ____________ Date: ____________  Start time: ____________ Stop time: ____________ Movements: ____________ Date: ____________ Start time: ____________ Stop time: ____________ Movements: ____________ Date: ____________ Start time: ____________ Stop time: ____________ Movements: ____________ Date: ____________ Start time: ____________ Stop time: ____________ Movements: ____________ Date: ____________ Start time: ____________ Stop time: ____________ Movements: ____________ Date: ____________ Start time: ____________ Stop time: ____________ Movements: ____________ Date: ____________ Start time: ____________ Stop time: ____________ Movements: ____________ This information is not intended to replace advice given to you by your health care provider. Make sure you discuss any questions you have with your health care provider. Document Revised: 05/04/2019 Document Reviewed: 05/04/2019 Elsevier Patient Education  2020 [employer]        Signs and Symptoms of Labor Labor is your body's natural process of moving your baby, placenta, and umbilical cord out of your uterus. The process of labor usually starts when your baby is full-term, between 37 and 40 weeks of pregnancy. How will I know when I am close to going into labor? As your body prepares for labor and the birth of your baby, you may notice the following symptoms in the weeks and days before true labor starts:  Having a strong desire to get your home ready to receive your new baby. This is called nesting. Nesting may be a sign that labor is approaching, and it may occur several weeks before birth. Nesting may involve cleaning and organizing your home.  Passing a small amount of thick, bloody mucus out of your vagina (normal bloody show or losing your mucus plug). This may happen more than a   week before labor begins, or it might occur right before labor begins as the opening of the cervix starts to widen (dilate). For some women, the entire mucus plug passes at once. For others,  smaller portions of the mucus plug may gradually pass over several days.  Your baby moving (dropping) lower in your pelvis to get into position for birth (lightening). When this happens, you may feel more pressure on your bladder and pelvic bone and less pressure on your ribs. This may make it easier to breathe. It may also cause you to need to urinate more often and have problems with bowel movements.  Having "practice contractions" (Braxton Hicks contractions) that occur at irregular (unevenly spaced) intervals that are more than 10 minutes apart. This is also called false labor. False labor contractions are common after exercise or sexual activity, and they will stop if you change position, rest, or drink fluids. These contractions are usually mild and do not get stronger over time. They may feel like: ? A backache or back pain. ? Mild cramps, similar to menstrual cramps. ? Tightening or pressure in your abdomen. Other early symptoms that labor may be starting soon include:  Nausea or loss of appetite.  Diarrhea.  Having a sudden burst of energy, or feeling very tired.  Mood changes.  Having trouble sleeping. How will I know when labor has begun? Signs that true labor has begun may include:  Having contractions that come at regular (evenly spaced) intervals and increase in intensity. This may feel like more intense tightening or pressure in your abdomen that moves to your back. ? Contractions may also feel like rhythmic pain in your upper thighs or back that comes and goes at regular intervals. ? For first-time mothers, this change in intensity of contractions often occurs at a more gradual pace. ? Women who have given birth before may notice a more rapid progression of contraction changes.  Having a feeling of pressure in the vaginal area.  Your water breaking (rupture of membranes). This is when the sac of fluid that surrounds your baby breaks. When this happens, you will notice  fluid leaking from your vagina. This may be clear or blood-tinged. Labor usually starts within 24 hours of your water breaking, but it may take longer to begin. ? Some women notice this as a gush of fluid. ? Others notice that their underwear repeatedly becomes damp. Follow these instructions at home:   When labor starts, or if your water breaks, call your health care provider or nurse care line. Based on your situation, they will determine when you should go in for an exam.  When you are in early labor, you may be able to rest and manage symptoms at home. Some strategies to try at home include: ? Breathing and relaxation techniques. ? Taking a warm bath or shower. ? Listening to music. ? Using a heating pad on the lower back for pain. If you are directed to use heat:  Place a towel between your skin and the heat source.  Leave the heat on for 20-30 minutes.  Remove the heat if your skin turns bright red. This is especially important if you are unable to feel pain, heat, or cold. You may have a greater risk of getting burned. Get help right away if:  You have painful, regular contractions that are 5 minutes apart or less.  Labor starts before you are [redacted] weeks along in your pregnancy.  You have a fever.  You have   a headache that does not go away.  You have bright red blood coming from your vagina.  You do not feel your baby moving.  You have a sudden onset of: ? Severe headache with vision problems. ? Nausea, vomiting, or diarrhea. ? Chest pain or shortness of breath. These symptoms may be an emergency. If your health care provider recommends that you go to the hospital or birth center where you plan to deliver, do not drive yourself. Have someone else drive you, or call emergency services (911 in the [place] Summary  Labor is your body's natural process of moving your baby, placenta, and umbilical cord out of your uterus.  The process of labor usually starts when your baby is  full-term, between 37 and 40 weeks of pregnancy.  When labor starts, or if your water breaks, call your health care provider or nurse care line. Based on your situation, they will determine when you should go in for an exam. This information is not intended to replace advice given to you by your health care provider. Make sure you discuss any questions you have with your health care provider. Document Revised: 06/14/2017 Document Reviewed: 02/19/2017 Elsevier Patient Education  2020 Elsevier Inc.  

## 2020-01-22 NOTE — MAU Note (Addendum)
Pt reports decreased fetal movement today. She reports she can feel the baby move, but it's "not as active as usual". Pt denies contractions, LOF, or vaginal bleeding. FHR 160 in triage with audible fetal movement noted. Scheduled for rCS on 02/20/2020.

## 2020-01-31 ENCOUNTER — Inpatient Hospital Stay (HOSPITAL_COMMUNITY): Payer: 59 | Admitting: Anesthesiology

## 2020-01-31 ENCOUNTER — Other Ambulatory Visit: Payer: Self-pay

## 2020-01-31 ENCOUNTER — Encounter (HOSPITAL_COMMUNITY)
Admission: AD | Disposition: A | Discharge status: Home / Self Care | Payer: Self-pay | Source: Home / Self Care | Attending: Obstetrics and Gynecology

## 2020-01-31 ENCOUNTER — Encounter (HOSPITAL_COMMUNITY): Payer: Self-pay | Admitting: Obstetrics and Gynecology

## 2020-01-31 ENCOUNTER — Inpatient Hospital Stay (HOSPITAL_COMMUNITY)
Admission: AD | Admit: 2020-01-31 | Discharge: 2020-02-03 | DRG: 788 | Disposition: A | Discharge status: Home / Self Care | Payer: 59 | Attending: Obstetrics and Gynecology | Admitting: Obstetrics and Gynecology

## 2020-01-31 DIAGNOSIS — O42913 Preterm premature rupture of membranes, unspecified as to length of time between rupture and onset of labor, third trimester: Secondary | ICD-10-CM

## 2020-01-31 DIAGNOSIS — Z20822 Contact with and (suspected) exposure to covid-19: Secondary | ICD-10-CM | POA: Diagnosis present

## 2020-01-31 DIAGNOSIS — O4290 Premature rupture of membranes, unspecified as to length of time between rupture and onset of labor, unspecified weeks of gestation: Secondary | ICD-10-CM | POA: Diagnosis present

## 2020-01-31 DIAGNOSIS — Z3A36 36 weeks gestation of pregnancy: Secondary | ICD-10-CM

## 2020-01-31 DIAGNOSIS — O34211 Maternal care for low transverse scar from previous cesarean delivery: Secondary | ICD-10-CM | POA: Diagnosis present

## 2020-01-31 DIAGNOSIS — Z98891 History of uterine scar from previous surgery: Secondary | ICD-10-CM

## 2020-01-31 DIAGNOSIS — O42919 Preterm premature rupture of membranes, unspecified as to length of time between rupture and onset of labor, unspecified trimester: Secondary | ICD-10-CM

## 2020-01-31 LAB — CBC
HCT: 36.1 % (ref 36.0–46.0)
Hemoglobin: 11.9 g/dL — ABNORMAL LOW (ref 12.0–15.0)
MCH: 30.7 pg (ref 26.0–34.0)
MCHC: 33 g/dL (ref 30.0–36.0)
MCV: 93.3 fL (ref 80.0–100.0)
Platelets: 195 10*3/uL (ref 150–400)
RBC: 3.87 MIL/uL (ref 3.87–5.11)
RDW: 13.2 % (ref 11.5–15.5)
WBC: 8.4 10*3/uL (ref 4.0–10.5)
nRBC: 0 % (ref 0.0–0.2)

## 2020-01-31 LAB — TYPE AND SCREEN
ABO/RH(D): O POS
Antibody Screen: NEGATIVE

## 2020-01-31 LAB — RESPIRATORY PANEL BY RT PCR (FLU A&B, COVID)
Influenza A by PCR: NEGATIVE
Influenza B by PCR: NEGATIVE
SARS Coronavirus 2 by RT PCR: NEGATIVE

## 2020-01-31 LAB — POCT FERN TEST: POCT Fern Test: POSITIVE

## 2020-01-31 LAB — GROUP B STREP BY PCR: Group B strep by PCR: NEGATIVE

## 2020-01-31 LAB — RPR: RPR Ser Ql: NONREACTIVE

## 2020-01-31 LAB — ABO/RH: ABO/RH(D): O POS

## 2020-01-31 SURGERY — Surgical Case
Anesthesia: Regional

## 2020-01-31 SURGERY — Surgical Case
Anesthesia: Spinal | Wound class: Clean Contaminated

## 2020-01-31 MED ORDER — LACTATED RINGERS IV BOLUS
1000.0000 mL | Freq: Once | INTRAVENOUS | Status: AC
  Administered 2020-01-31: 1000 mL via INTRAVENOUS

## 2020-01-31 MED ORDER — STERILE WATER FOR IRRIGATION IR SOLN
Status: DC | PRN
  Administered 2020-01-31: 1000 mL

## 2020-01-31 MED ORDER — ACETAMINOPHEN 500 MG PO TABS
1000.0000 mg | ORAL_TABLET | Freq: Four times a day (QID) | ORAL | Status: DC
  Administered 2020-01-31 – 2020-02-03 (×9): 1000 mg via ORAL
  Filled 2020-01-31 (×11): qty 2

## 2020-01-31 MED ORDER — SODIUM CHLORIDE 0.9% FLUSH: 3.0000 mL | INTRAVENOUS | Status: DC | PRN

## 2020-01-31 MED ORDER — LACTATED RINGERS IV SOLN: INTRAVENOUS | Status: DC

## 2020-01-31 MED ORDER — METHYLERGONOVINE MALEATE 0.2 MG/ML IJ SOLN: 0.2000 mg | INTRAMUSCULAR | Status: DC | PRN

## 2020-01-31 MED ORDER — SIMETHICONE 80 MG PO CHEW
80.0000 mg | CHEWABLE_TABLET | ORAL | Status: DC
  Administered 2020-01-31 – 2020-02-02 (×2): 80 mg via ORAL
  Filled 2020-01-31 (×2): qty 1

## 2020-01-31 MED ORDER — NALBUPHINE HCL 10 MG/ML IJ SOLN: 5.0000 mg | INTRAMUSCULAR | Status: DC | PRN

## 2020-01-31 MED ORDER — PHENYLEPHRINE HCL (PRESSORS) 10 MG/ML IV SOLN
INTRAVENOUS | Status: DC | PRN
  Administered 2020-01-31 (×2): 40 ug via INTRAVENOUS

## 2020-01-31 MED ORDER — OXYTOCIN 40 UNITS IN NORMAL SALINE INFUSION - SIMPLE MED
INTRAVENOUS | Status: DC | PRN
  Administered 2020-01-31: 40 [IU] via INTRAVENOUS

## 2020-01-31 MED ORDER — METHYLERGONOVINE MALEATE 0.2 MG PO TABS: 0.2000 mg | ORAL_TABLET | ORAL | Status: DC | PRN

## 2020-01-31 MED ORDER — NALBUPHINE HCL 10 MG/ML IJ SOLN: 5.0000 mg | Freq: Once | INTRAMUSCULAR | Status: DC | PRN

## 2020-01-31 MED ORDER — DIPHENHYDRAMINE HCL 25 MG PO CAPS: 25.0000 mg | ORAL_CAPSULE | Freq: Four times a day (QID) | ORAL | Status: DC | PRN

## 2020-01-31 MED ORDER — DIPHENHYDRAMINE HCL 25 MG PO CAPS: 25.0000 mg | ORAL_CAPSULE | ORAL | Status: DC | PRN

## 2020-01-31 MED ORDER — ZOLPIDEM TARTRATE 5 MG PO TABS: 5.0000 mg | ORAL_TABLET | Freq: Every evening | ORAL | Status: DC | PRN

## 2020-01-31 MED ORDER — MEPERIDINE HCL 25 MG/ML IJ SOLN
INTRAMUSCULAR | Status: DC | PRN
  Administered 2020-01-31: 12.5 mg via INTRAVENOUS

## 2020-01-31 MED ORDER — DEXAMETHASONE SODIUM PHOSPHATE 10 MG/ML IJ SOLN
INTRAMUSCULAR | Status: AC
  Filled 2020-01-31: qty 1

## 2020-01-31 MED ORDER — DIPHENHYDRAMINE HCL 50 MG/ML IJ SOLN: 12.5000 mg | INTRAMUSCULAR | Status: DC | PRN

## 2020-01-31 MED ORDER — PRENATAL MULTIVITAMIN CH
1.0000 | ORAL_TABLET | Freq: Every day | ORAL | Status: DC
  Administered 2020-02-01 – 2020-02-03 (×3): 1 via ORAL
  Filled 2020-01-31 (×3): qty 1

## 2020-01-31 MED ORDER — ONDANSETRON HCL 4 MG/2ML IJ SOLN
INTRAMUSCULAR | Status: DC | PRN
  Administered 2020-01-31: 4 mg via INTRAVENOUS

## 2020-01-31 MED ORDER — NALOXONE HCL 0.4 MG/ML IJ SOLN: 0.4000 mg | INTRAMUSCULAR | Status: DC | PRN

## 2020-01-31 MED ORDER — OXYCODONE HCL 5 MG PO TABS: 5.0000 mg | ORAL_TABLET | Freq: Once | ORAL | Status: DC | PRN

## 2020-01-31 MED ORDER — SIMETHICONE 80 MG PO CHEW
80.0000 mg | CHEWABLE_TABLET | Freq: Three times a day (TID) | ORAL | Status: DC
  Administered 2020-02-01 – 2020-02-03 (×7): 80 mg via ORAL
  Filled 2020-01-31 (×7): qty 1

## 2020-01-31 MED ORDER — FENTANYL CITRATE (PF) 100 MCG/2ML IJ SOLN
INTRAMUSCULAR | Status: AC
  Filled 2020-01-31: qty 2

## 2020-01-31 MED ORDER — CEFAZOLIN SODIUM-DEXTROSE 2-4 GM/100ML-% IV SOLN
2.0000 g | INTRAVENOUS | Status: AC
  Administered 2020-01-31: 2 g via INTRAVENOUS
  Filled 2020-01-31: qty 100

## 2020-01-31 MED ORDER — IBUPROFEN 800 MG PO TABS
800.0000 mg | ORAL_TABLET | Freq: Three times a day (TID) | ORAL | Status: AC
  Administered 2020-01-31 – 2020-02-03 (×8): 800 mg via ORAL
  Filled 2020-01-31 (×8): qty 1

## 2020-01-31 MED ORDER — KETOROLAC TROMETHAMINE 30 MG/ML IJ SOLN
30.0000 mg | Freq: Once | INTRAMUSCULAR | Status: AC | PRN
  Administered 2020-01-31: 30 mg via INTRAVENOUS

## 2020-01-31 MED ORDER — PHENYLEPHRINE HCL-NACL 20-0.9 MG/250ML-% IV SOLN
INTRAVENOUS | Status: AC
  Filled 2020-01-31: qty 250

## 2020-01-31 MED ORDER — FERROUS SULFATE 325 (65 FE) MG PO TABS
325.0000 mg | ORAL_TABLET | Freq: Two times a day (BID) | ORAL | Status: DC
  Administered 2020-02-01 – 2020-02-03 (×5): 325 mg via ORAL
  Filled 2020-01-31 (×6): qty 1

## 2020-01-31 MED ORDER — BUPIVACAINE IN DEXTROSE 0.75-8.25 % IT SOLN
INTRATHECAL | Status: DC | PRN
  Administered 2020-01-31: 1.6 mL via INTRATHECAL

## 2020-01-31 MED ORDER — OXYCODONE HCL 5 MG/5ML PO SOLN: 5.0000 mg | Freq: Once | ORAL | Status: DC | PRN

## 2020-01-31 MED ORDER — FENTANYL CITRATE (PF) 100 MCG/2ML IJ SOLN: 25.0000 ug | INTRAMUSCULAR | Status: DC | PRN

## 2020-01-31 MED ORDER — FENTANYL CITRATE (PF) 100 MCG/2ML IJ SOLN
INTRAMUSCULAR | Status: DC | PRN
  Administered 2020-01-31: 15 ug via INTRATHECAL

## 2020-01-31 MED ORDER — MENTHOL 3 MG MT LOZG: 1.0000 | LOZENGE | OROMUCOSAL | Status: DC | PRN

## 2020-01-31 MED ORDER — ONDANSETRON HCL 4 MG/2ML IJ SOLN
INTRAMUSCULAR | Status: AC
  Filled 2020-01-31: qty 2

## 2020-01-31 MED ORDER — PHENYLEPHRINE 40 MCG/ML (10ML) SYRINGE FOR IV PUSH (FOR BLOOD PRESSURE SUPPORT)
PREFILLED_SYRINGE | INTRAVENOUS | Status: AC
  Filled 2020-01-31: qty 10

## 2020-01-31 MED ORDER — PHENYLEPHRINE HCL-NACL 20-0.9 MG/250ML-% IV SOLN
INTRAVENOUS | Status: DC | PRN
  Administered 2020-01-31: 80 ug/min via INTRAVENOUS

## 2020-01-31 MED ORDER — OXYCODONE HCL 5 MG PO TABS: 5.0000 mg | ORAL_TABLET | ORAL | Status: DC | PRN

## 2020-01-31 MED ORDER — OXYTOCIN 40 UNITS IN NORMAL SALINE INFUSION - SIMPLE MED: 2.5000 [IU]/h | INTRAVENOUS | Status: AC

## 2020-01-31 MED ORDER — DEXAMETHASONE SODIUM PHOSPHATE 10 MG/ML IJ SOLN
INTRAMUSCULAR | Status: DC | PRN
  Administered 2020-01-31: 10 mg via INTRAVENOUS

## 2020-01-31 MED ORDER — KETOROLAC TROMETHAMINE 30 MG/ML IJ SOLN
INTRAMUSCULAR | Status: AC
  Filled 2020-01-31: qty 1

## 2020-01-31 MED ORDER — MORPHINE SULFATE (PF) 0.5 MG/ML IJ SOLN
INTRAMUSCULAR | Status: DC | PRN
  Administered 2020-01-31: .15 mg via INTRATHECAL

## 2020-01-31 MED ORDER — DIBUCAINE (PERIANAL) 1 % EX OINT: 1.0000 "application " | TOPICAL_OINTMENT | CUTANEOUS | Status: DC | PRN

## 2020-01-31 MED ORDER — MEPERIDINE HCL 25 MG/ML IJ SOLN
INTRAMUSCULAR | Status: AC
  Filled 2020-01-31: qty 1

## 2020-01-31 MED ORDER — NALOXONE HCL 4 MG/10ML IJ SOLN
1.0000 ug/kg/h | INTRAVENOUS | Status: DC | PRN
  Filled 2020-01-31: qty 5

## 2020-01-31 MED ORDER — WITCH HAZEL-GLYCERIN EX PADS: 1.0000 "application " | MEDICATED_PAD | CUTANEOUS | Status: DC | PRN

## 2020-01-31 MED ORDER — SENNOSIDES-DOCUSATE SODIUM 8.6-50 MG PO TABS
2.0000 | ORAL_TABLET | ORAL | Status: DC
  Administered 2020-01-31 – 2020-02-02 (×2): 2 via ORAL
  Filled 2020-01-31 (×2): qty 2

## 2020-01-31 MED ORDER — SOD CITRATE-CITRIC ACID 500-334 MG/5ML PO SOLN
30.0000 mL | Freq: Once | ORAL | Status: AC
  Administered 2020-01-31: 30 mL via ORAL
  Filled 2020-01-31: qty 30

## 2020-01-31 MED ORDER — FAMOTIDINE IN NACL 20-0.9 MG/50ML-% IV SOLN
20.0000 mg | Freq: Once | INTRAVENOUS | Status: AC
  Administered 2020-01-31: 20 mg via INTRAVENOUS
  Filled 2020-01-31: qty 50

## 2020-01-31 MED ORDER — COCONUT OIL OIL: 1.0000 "application " | TOPICAL_OIL | Status: DC | PRN

## 2020-01-31 MED ORDER — MORPHINE SULFATE (PF) 0.5 MG/ML IJ SOLN
INTRAMUSCULAR | Status: AC
  Filled 2020-01-31: qty 10

## 2020-01-31 MED ORDER — SIMETHICONE 80 MG PO CHEW: 80.0000 mg | CHEWABLE_TABLET | ORAL | Status: DC | PRN

## 2020-01-31 MED ORDER — SCOPOLAMINE 1 MG/3DAYS TD PT72: 1.0000 | MEDICATED_PATCH | Freq: Once | TRANSDERMAL | Status: DC

## 2020-01-31 MED ORDER — ONDANSETRON HCL 4 MG/2ML IJ SOLN
4.0000 mg | Freq: Three times a day (TID) | INTRAMUSCULAR | Status: DC | PRN
  Administered 2020-01-31: 4 mg via INTRAVENOUS
  Filled 2020-01-31: qty 2

## 2020-01-31 MED ORDER — ONDANSETRON HCL 4 MG/2ML IJ SOLN: 4.0000 mg | Freq: Once | INTRAMUSCULAR | Status: DC | PRN

## 2020-01-31 MED ORDER — SODIUM CHLORIDE 0.9 % IR SOLN
Status: DC | PRN
  Administered 2020-01-31: 1000 mL

## 2020-01-31 MED ORDER — OXYTOCIN 40 UNITS IN NORMAL SALINE INFUSION - SIMPLE MED
INTRAVENOUS | Status: AC
  Filled 2020-01-31: qty 1000

## 2020-01-31 MED ORDER — HYDROMORPHONE HCL 1 MG/ML IJ SOLN: 0.2000 mg | INTRAMUSCULAR | Status: DC | PRN

## 2020-01-31 SURGICAL SUPPLY — 37 items
BARRIER ADHS 3X4 INTERCEED (GAUZE/BANDAGES/DRESSINGS) IMPLANT
BENZOIN TINCTURE PRP APPL 2/3 (GAUZE/BANDAGES/DRESSINGS) ×3 IMPLANT
CHLORAPREP W/TINT 26ML (MISCELLANEOUS) ×3 IMPLANT
CLAMP CORD UMBIL (MISCELLANEOUS) IMPLANT
CLOSURE WOUND 1/2 X4 (GAUZE/BANDAGES/DRESSINGS) ×1
CLOTH BEACON ORANGE TIMEOUT ST (SAFETY) ×3 IMPLANT
DRSG OPSITE POSTOP 4X10 (GAUZE/BANDAGES/DRESSINGS) ×3 IMPLANT
ELECT REM PT RETURN 9FT ADLT (ELECTROSURGICAL) ×3
ELECTRODE REM PT RTRN 9FT ADLT (ELECTROSURGICAL) ×1 IMPLANT
EXTRACTOR VACUUM KIWI (MISCELLANEOUS) IMPLANT
GAUZE SPONGE 4X4 12PLY STRL LF (GAUZE/BANDAGES/DRESSINGS) ×4 IMPLANT
GLOVE BIOGEL M 7.0 STRL (GLOVE) ×6 IMPLANT
GLOVE BIOGEL PI IND STRL 7.0 (GLOVE) ×3 IMPLANT
GLOVE BIOGEL PI INDICATOR 7.0 (GLOVE) ×6
GOWN STRL REUS W/TWL LRG LVL3 (GOWN DISPOSABLE) ×9 IMPLANT
KIT ABG SYR 3ML LUER SLIP (SYRINGE) IMPLANT
NDL HYPO 25X5/8 SAFETYGLIDE (NEEDLE) IMPLANT
NEEDLE HYPO 25X5/8 SAFETYGLIDE (NEEDLE) IMPLANT
NS IRRIG 1000ML POUR BTL (IV SOLUTION) ×3 IMPLANT
PACK C SECTION WH (CUSTOM PROCEDURE TRAY) ×3 IMPLANT
PAD ABD 7.5X8 STRL (GAUZE/BANDAGES/DRESSINGS) ×2 IMPLANT
PAD OB MATERNITY 4.3X12.25 (PERSONAL CARE ITEMS) ×3 IMPLANT
PENCIL SMOKE EVAC W/HOLSTER (ELECTROSURGICAL) ×3 IMPLANT
RTRCTR C-SECT PINK 25CM LRG (MISCELLANEOUS) IMPLANT
STRIP CLOSURE SKIN 1/2X4 (GAUZE/BANDAGES/DRESSINGS) ×2 IMPLANT
SUT PDS AB 0 CT1 27 (SUTURE) ×6 IMPLANT
SUT PLAIN 0 NONE (SUTURE) IMPLANT
SUT VIC AB 0 CTX 36 (SUTURE) ×6
SUT VIC AB 0 CTX36XBRD ANBCTRL (SUTURE) ×3 IMPLANT
SUT VIC AB 2-0 CT1 27 (SUTURE) ×4
SUT VIC AB 2-0 CT1 TAPERPNT 27 (SUTURE) ×1 IMPLANT
SUT VIC AB 3-0 SH 27 (SUTURE)
SUT VIC AB 3-0 SH 27X BRD (SUTURE) IMPLANT
SUT VIC AB 4-0 KS 27 (SUTURE) ×3 IMPLANT
TOWEL OR 17X24 6PK STRL BLUE (TOWEL DISPOSABLE) ×3 IMPLANT
TRAY FOLEY W/BAG SLVR 14FR LF (SET/KITS/TRAYS/PACK) ×3 IMPLANT
WATER STERILE IRR 1000ML POUR (IV SOLUTION) ×3 IMPLANT

## 2020-01-31 NOTE — MAU Note (Signed)
[place], RN notified Anesthesiologist about pt coming for C/S at 11. She advised not to give additional dose of pepcid. [place], RN called OB OR Morrie Sheldon, RN) nurse and I was told to call [employer] nurse because RN was unaware who her nurse would be. Called [facility] charge nurse and she advised me to give report to CRNA and OB OR nurse when bringing pt up.

## 2020-01-31 NOTE — H&P (Signed)
Jacqueline Lynch is a 39 y.o. female presenting for SROM at 0130.  She is a G3P1011 at 36.1 for a repeat CS She was evaluated in the MAU with confirmed ROM, no bleeding, no contractions Cat I tracing, Vertex by [place] NPO status ate and drank around 2200-2300  OB History    Gravida  3   Para  1   Term  1   Preterm      AB  1   Living  1     SAB  1   TAB      Ectopic      Multiple      Live Births  1          Past Medical History:  Diagnosis Date  . Medical history non-contributory    Past Surgical History:  Procedure Laterality Date  . CESAREAN SECTION N/A 12/28/2013   Procedure: Primary Cesarean Section Delivery Baby Boy @ 2109, Apgars 8/9;  Surgeon: Maeola Sarah. Landry Mellow, MD;  Location: [facility];  Service: Obstetrics;  Laterality: N/A;  . NO PAST SURGERIES     Family History: family history is not on file. Social History:  reports that she has never smoked. She has never used smokeless tobacco. She reports that she does not drink alcohol or use drugs.     Maternal Diabetes: No Genetic Screening: Normal Maternal Ultrasounds/Referrals: Normal Fetal Ultrasounds or other Referrals:  None Maternal Substance Abuse:  No Significant Maternal Medications:  None Significant Maternal Lab Results:  Other: GBS unknown. Rapid GBS ordered Other Comments:  None  Review of Systems  Constitutional: Negative.   HENT: Negative.   Eyes: Negative.   Respiratory: Negative.   Cardiovascular: Negative.   Gastrointestinal: Negative.   Endocrine: Negative.   Genitourinary: Negative.   Musculoskeletal: Negative.   Skin: Negative.   Allergic/Immunologic: Negative.   Neurological: Negative.   Hematological: Negative.   Psychiatric/Behavioral: Negative.    History Dilation: 2 Effacement (%): 90 Station: -2 Exam by:: m williams cnm Blood pressure 112/72, pulse 91, temperature 98.8 F (37.1 C), temperature source Oral, resp. rate 16, height 5' 4"  (1.626 m), weight 74.8 kg, SpO2 99 %,  unknown if currently breastfeeding. Exam Physical Exam  Constitutional: She is oriented to person, place, and time. She appears well-developed and well-nourished.  HENT:  Head: Normocephalic.  Eyes: Pupils are equal, round, and reactive to light.  Cardiovascular: Normal rate.  Respiratory: Effort normal.  GI: Soft.  Musculoskeletal:        General: Normal range of motion.     Cervical back: Normal range of motion.  Neurological: She is alert and oriented to person, place, and time.  Skin: Skin is warm and dry.  Psychiatric: She has a normal mood and affect. Her behavior is normal. Judgment and thought content normal.   Cat I FHR tracing  Prenatal labs: ABO, Rh: --/--/O POS, O POS Performed at [facility], [address], [place], [place] [address]  [phone]) Antibody: NEG (05/05 0330) Rubella:  Immune. RPR:   Neg HBsAg:   neg HIV:   neg GBS: NEGATIVE/-- (05/05 0356)   Assessment/Plan: Plan for nonemergent RCS once NPO status met Will update Dr. Charlesetta Garibaldi at that time unless pt status changes Rapid GBS Routine Preop orders    Jacqueline Lynch 01/31/2020, 5:46 AM

## 2020-01-31 NOTE — Anesthesia Preprocedure Evaluation (Addendum)
Anesthesia Evaluation  Patient identified by MRN, date of birth, ID band Patient awake    Reviewed: Allergy & Precautions, NPO status , Patient's Chart, lab work & pertinent test results  History of Anesthesia Complications Negative for: history of anesthetic complications  Airway Mallampati: II  TM Distance: >3 FB Neck ROM: Full    Dental   Pulmonary neg pulmonary ROS,    Pulmonary exam normal        Cardiovascular negative cardio ROS Normal cardiovascular exam     Neuro/Psych negative neurological ROS  negative psych ROS   GI/Hepatic negative GI ROS, Neg liver ROS,   Endo/Other  negative endocrine ROS  Renal/GU negative Renal ROS  negative genitourinary   Musculoskeletal negative musculoskeletal ROS (+)   Abdominal   Peds  Hematology negative hematology ROS (+)   Anesthesia Other Findings  1 prior C/S  Reproductive/Obstetrics (+) Pregnancy                            Anesthesia Physical Anesthesia Plan  ASA: II  Anesthesia Plan: Spinal   Post-op Pain Management:    Induction:   PONV Risk Score and Plan: 2 and Propofol infusion, Treatment may vary due to age or medical condition, Ondansetron and TIVA  Airway Management Planned: Nasal Cannula and Simple Face Mask  Additional Equipment: None  Intra-op Plan:   Post-operative Plan:   Informed Consent: I have reviewed the patients History and Physical, chart, labs and discussed the procedure including the risks, benefits and alternatives for the proposed anesthesia with the patient or authorized representative who has indicated his/her understanding and acceptance.       Plan Discussed with:   Anesthesia Plan Comments:        Anesthesia Quick Evaluation

## 2020-01-31 NOTE — Transfer of Care (Signed)
Immediate Anesthesia Transfer of Care Note  Patient: Jacqueline Lynch  Procedure(s) Performed: CESAREAN SECTION (N/A )  Patient Location: PACU  Anesthesia Type:Spinal  Level of Consciousness: awake, alert , oriented and patient cooperative  Airway & Oxygen Therapy: Patient Spontanous Breathing  Post-op Assessment: Report given to RN and Post -op Vital signs reviewed and stable  Post vital signs: Reviewed and stable  Last Vitals:  Vitals Value Taken Time  BP    Temp    Pulse    Resp    SpO2      Last Pain:  Vitals:   01/31/20 0938  TempSrc: Oral  PainSc: 0-No pain      Patients Stated Pain Goal: 0 (01/31/20 0253)  Complications: No apparent anesthesia complications

## 2020-01-31 NOTE — MAU Note (Addendum)
Water broke at 0130.  Clear fluid.  Baby been moving. Contractions mild. Scheduled for repeat C/S on 5/25

## 2020-01-31 NOTE — MAU Provider Note (Signed)
Chief Complaint:  Rupture of Membranes   First Provider Initiated Contact with Patient 01/31/20 0315     HPI: Jacqueline Lynch is a 39 y.o. G3P1011 at 80w1dwho presents to maternity admissions reporting gushes of fluid.  RN exam was negative for fern so I was asked to do a speculum exam. She is planning a repeat C/S. Marland Kitchen She reports good fetal movement, denies vaginal bleeding, vaginal itching/burning, urinary symptoms, h/a, dizziness, n/v, diarrhea, constipation or fever/chills.    Vaginal Discharge The patient's primary symptoms include vaginal discharge. The patient's pertinent negatives include no genital itching, genital lesions, genital odor, pelvic pain or vaginal bleeding. This is a new problem. The current episode started today. The problem occurs intermittently. The problem has been unchanged. Pertinent negatives include no constipation, diarrhea, dysuria, fever, flank pain, frequency or nausea. The vaginal discharge was watery and clear. There has been no bleeding. She has not been passing clots. She has not been passing tissue. Nothing aggravates the symptoms.   Past Medical History: Past Medical History:  Diagnosis Date  . Medical history non-contributory     Past obstetric history: OB History  Gravida Para Term Preterm AB Living  3 1 1   1 1   SAB TAB Ectopic Multiple Live Births  1       1    # Outcome Date GA Lbr Len/2nd Weight Sex Delivery Anes PTL Lv  3 Current           2 Term 12/28/13 [redacted]w[redacted]d 03:27 / 04:08 3080 g M CS-LTranv EPI  LIV  1 SAB 2013            Past Surgical History: Past Surgical History:  Procedure Laterality Date  . CESAREAN SECTION N/A 12/28/2013   Procedure: Primary Cesarean Section Delivery Baby Boy @ 2109, Apgars 8/9;  Surgeon: Maeola Sarah. Landry Mellow, MD;  Location: [facility];  Service: Obstetrics;  Laterality: N/A;  . NO PAST SURGERIES      Family History: Family History  Problem Relation Age of Onset  . Alcohol abuse Neg Hx   . Arthritis Neg Hx   . Asthma  Neg Hx   . Birth defects Neg Hx   . Cancer Neg Hx   . COPD Neg Hx   . Depression Neg Hx   . Diabetes Neg Hx   . Drug abuse Neg Hx   . Early death Neg Hx   . Hearing loss Neg Hx   . Heart disease Neg Hx   . Hyperlipidemia Neg Hx   . Hypertension Neg Hx   . Kidney disease Neg Hx   . Learning disabilities Neg Hx   . Mental illness Neg Hx   . Mental retardation Neg Hx   . Miscarriages / Stillbirths Neg Hx   . Stroke Neg Hx   . Vision loss Neg Hx     Social History: Social History   Tobacco Use  . Smoking status: Never Smoker  . Smokeless tobacco: Never Used  Substance Use Topics  . Alcohol use: No  . Drug use: No    Allergies: No Known Allergies  Meds:  Medications Prior to Admission  Medication Sig Dispense Refill Last Dose  . Prenatal Vit-Fe Fumarate-FA (PRENATAL MULTIVITAMIN) TABS tablet Take 1 tablet by mouth at bedtime.   01/30/2020 at Unknown time  . senna-docusate (SENOKOT-S) 8.6-50 MG per tablet Take 2 tablets by mouth daily. 30 tablet 0     I have reviewed patient's Past Medical Hx, Surgical Hx, Family Hx, Social  Hx, medications and allergies.   ROS:  Review of Systems  Constitutional: Negative for fever.  Gastrointestinal: Negative for constipation, diarrhea and nausea.  Genitourinary: Positive for vaginal discharge. Negative for dysuria, flank pain, frequency and pelvic pain.   Other systems negative  Physical Exam   Patient Vitals for the past 24 hrs:  BP Temp Temp src Pulse Resp SpO2 Height Weight  01/31/20 0303 112/72 98.6 F (37 C) Oral 91 16 99 % 5\' 4"  (1.626 m) 74.8 kg   Constitutional: Well-developed, well-nourished female in no acute distress.  Cardiovascular: normal rate and rhythm Respiratory: normal effort, clear to auscultation bilaterally GI: Abd soft, non-tender, gravid appropriate for gestational age.   No rebound or guarding. MS: Extremities nontender, no edema, normal ROM Neurologic: Alert and oriented x 4.  GU: Neg CVAT.  PELVIC  EXAM: Gross pooling of clear fluid, copious in amount. Dilation: 2 Effacement (%): 90 Station: -2 Presentation: Vertex Exam by:: m Xavia Kniskern cnm  FHT:  Baseline 140 , moderate variability, accelerations present, no decelerations Contractions: uterine irritability   Labs: No results found for this or any previous visit (from the past 24 hour(s)).   Imaging:  No results found.  MAU Course/MDM: Speculum exam is positive for ruptured membranes NST reviewed, reactive, Category I Consult Dr [phone] by RN with presentation, exam findings and test results.   Assessment: Single intrauterine pregnancy at [redacted]w[redacted]d PPROM Prior Cesarean Delivery  Plan: Admit for repeat C/S RN to contact MD  [redacted]w[redacted]d CNM, MSN Certified Nurse-Midwife 01/31/2020 3:26 AM

## 2020-01-31 NOTE — Lactation Note (Signed)
This note was copied from a baby's chart. Lactation Consultation Note  Patient Name: Jacqueline Lynch [id number] Date: 01/31/2020 Reason for consult: Initial assessment;Late-preterm 34-36.6wks P2, 9 hour LPTI. Tools given: hand pump, breast shells, 20 mm NS and DEBP due mom having short shaft nipples that become flat when compressed and infant being LPTI who is not latching well doesn't sustain latch currently is being supplemented with 22 kcal Similac Neosure with iron after being breastfeed  this is parents choice. Mom is experienced at breastfeeding she breastfed her 38 year old son for 5 months and she does have DEBP at home. LC entered room mom had emesis this was her third time,  LC  Gave mom warm cloth for her face and water , LC alerted RN of mom's emesis. Infant alert in basinet, per mom,  infant has not been latching well, infant  latched briefly in recovery first time, 2nd time 5 minutes with 20 mm NS and then was supplemented with 10 mls of 22kcal formula. This will be mom;s 3rd attempts to breastfeed infant. LC discussed LPTI behaviors and infant feeding policy: Mom understands infant can become sleepy easily, mom will breastfeed infant STS, according cues, 8 -12 times within 24 hours and not go past 3 hours without breastfeeding infant, and will not exceed 30 minutes when feeding infant, mom will supplement infant with EBM or formula after breastfeeding infant at each feeding (green sheet) given. Ecru asked mom to pre-pump breast prior to applying 20 mm NS, mom attempted to latch infant on her right breast using the foot ball hold position, infant latched briefly on 3 minutes but did not sustain latch, had mucus come out of nose that was suction, infant no longer interested in latching at breast, LC notice infant sucks bottom lip in and chin appears recessive, infant took 7 mls of Similac Neosure with iron 22 kcal by curve tip syringe this is mom's choice she did not want to use bottle nipple  at this time. Mom knows to call RN or LC if she needs further assistance with latching infant at breast. Mom will continue to work toward latching infant at breast. Mom will use DEBP every 3 hours for 15 minutes on initial setting to help establish milk supply due to infant being LPTI and not latching well at breast. Mom shown how to use DEBP & how to disassemble, clean, & reassemble parts. Mom made aware of O/P services, breastfeeding support groups, community resources, and our phone # for post-discharge questions.   Maternal Data Formula Feeding for Exclusion: No Has patient been taught Hand Expression?: Yes Does the patient have breastfeeding experience prior to this delivery?: Yes  Feeding Feeding Type: Breast Fed  LATCH Score Latch: Repeated attempts needed to sustain latch, nipple held in mouth throughout feeding, stimulation needed to elicit sucking reflex.  Audible Swallowing: A few with stimulation  Type of Nipple: Flat  Comfort (Breast/Nipple): Soft / non-tender  Hold (Positioning): Assistance needed to correctly position infant at breast and maintain latch.  LATCH Score: 6  Interventions Interventions: Breast feeding basics reviewed;Breast compression;Adjust position;Assisted with latch;Support pillows;Skin to skin;DEBP;Hand pump;Shells;Hand express;Breast massage;Position options;Pre-pump if needed  Lactation Tools Discussed/Used Tools: Shells;Pump;Nipple Shields Nipple shield size: 20 Shell Type: Other (comment)(short shaft and nipples becomes flat when compressed.) Breast pump type: Double-Electric Breast Pump;Manual WIC Program: No Pump Review: Setup, frequency, and cleaning;Milk Storage Initiated by:: RN Date initiated:: 01/31/20   Consult Status Consult Status: Follow-up Date: 02/01/20 Follow-up type: In-patient  Danelle Earthly 01/31/2020, 9:19 PM

## 2020-01-31 NOTE — Anesthesia Procedure Notes (Signed)
Spinal  Patient location during procedure: OR Staffing Performed: anesthesiologist  Anesthesiologist: Norrine Ballester E, MD Preanesthetic Checklist Completed: patient identified, IV checked, risks and benefits discussed, surgical consent, monitors and equipment checked, pre-op evaluation and timeout performed Spinal Block Patient position: sitting Prep: DuraPrep and site prepped and draped Patient monitoring: continuous pulse ox, blood pressure and heart rate Approach: midline Location: L3-4 Injection technique: single-shot Needle Needle type: Pencan  Needle gauge: 24 G Needle length: 9 cm Additional Notes Functioning IV was confirmed and monitors were applied. Sterile prep and drape, including hand hygiene and sterile gloves were used. The patient was positioned and the spine was prepped. The skin was anesthetized with lidocaine.  Free flow of clear CSF was obtained prior to injecting local anesthetic into the CSF. The needle was carefully withdrawn. The patient tolerated the procedure well.      

## 2020-01-31 NOTE — MAU Note (Signed)
Spoke with house coverage ensuring that she is aware of pt's scheduled cesarean at 1100.

## 2020-01-31 NOTE — Anesthesia Postprocedure Evaluation (Signed)
Anesthesia Post Note  Patient: Jacqueline Lynch  Procedure(s) Performed: CESAREAN SECTION (N/A )     Patient location during evaluation: PACU Anesthesia Type: Spinal Level of consciousness: oriented and awake and alert Pain management: pain level controlled Vital Signs Assessment: post-procedure vital signs reviewed and stable Respiratory status: spontaneous breathing, respiratory function stable and nonlabored ventilation Cardiovascular status: blood pressure returned to baseline and stable Postop Assessment: no headache, no backache, no apparent nausea or vomiting and spinal receding Anesthetic complications: no    Last Vitals:  Vitals:   01/31/20 1612 01/31/20 1710  BP: 110/74 114/77  Pulse: 62 63  Resp: 18 18  Temp: 36.7 C 36.7 C  SpO2: 98% 98%    Last Pain:  Vitals:   01/31/20 1710  TempSrc: Oral  PainSc:    Pain Goal: Patients Stated Pain Goal: 0 (01/31/20 0253)                 Lucretia Kern

## 2020-01-31 NOTE — Op Note (Signed)
Cesarean Section Procedure Note  Indications: H/O cesarean section patient desires repeat ... PPROM at 36 wks and 1 day   Pre-operative Diagnosis: 36 week 1 day pregnancy.  Post-operative Diagnosis: same  Surgeon: Gerald Leitz M.D.   Assistants: None  Anesthesia: Spinal anesthesia  ASA Class: 2   Procedure Details   The patient was seen in the Holding Room. The risks, benefits, complications, treatment options, and expected outcomes were discussed with the patient.  The patient concurred with the proposed plan, giving informed consent.  The site of surgery properly noted/marked. The patient was taken to Operating Room # C, identified as Jacqueline Lynch and the procedure verified as C-Section Delivery. A Time Out was held and the above information confirmed.  After induction of anesthesia, the patient was draped and prepped in the usual sterile manner. A Pfannenstiel incision was made and carried down through the subcutaneous tissue to the fascia. Fascial incision was made and extended transversely. The fascia was separated from the underlying rectus tissue superiorly and inferiorly. The peritoneum was identified and entered. Peritoneal incision was extended longitudinally. The utero-vesical peritoneal reflection was incised transversely and the bladder flap was bluntly freed from the lower uterine segment. A low transverse uterine incision was made. Delivered from cephalic presentation was a 2320  gram Female with Apgar scores of 6 at one minute and 9 at five minutes. After the umbilical cord was clamped and cut cord blood was obtained for evaluation. The placenta was removed intact and appeared normal. The uterine outline, tubes and ovaries appeared normal. The uterine incision was closed with running locked sutures of 0 vicryl . A second layer of 0 vicryl was used to imbricate the incision .Marland Kitchen Hemostasis was observed. Lavage was carried out until clear. Interseed was placed along the uterine  incision.  The fascia was then reapproximated with running sutures of 0 pds. The skin was reapproximated with 4-0 vicryl .  Instrument, sponge, and needle counts were correct prior the abdominal closure and at the conclusion of the case.   Findings: Female infant in the cephalic presentation nuchal cord x 1  Estimated Blood Loss:  less than 200 mL         Drains: None         Total IV Fluids:  Per anesthesia ml         Specimens: Placenta sent to labor and delivery           Implants: None         Complications:  None; patient tolerated the procedure well.         Disposition: PACU - hemodynamically stable.         Condition: stable  Attending Attestation: I performed the procedure.

## 2020-02-01 LAB — CBC
HCT: 33.9 % — ABNORMAL LOW (ref 36.0–46.0)
Hemoglobin: 11.2 g/dL — ABNORMAL LOW (ref 12.0–15.0)
MCH: 31.2 pg (ref 26.0–34.0)
MCHC: 33 g/dL (ref 30.0–36.0)
MCV: 94.4 fL (ref 80.0–100.0)
Platelets: 191 10*3/uL (ref 150–400)
RBC: 3.59 MIL/uL — ABNORMAL LOW (ref 3.87–5.11)
RDW: 13.3 % (ref 11.5–15.5)
WBC: 10.2 10*3/uL (ref 4.0–10.5)
nRBC: 0 % (ref 0.0–0.2)

## 2020-02-01 MED ORDER — ONDANSETRON HCL 4 MG PO TABS: 4.0000 mg | ORAL_TABLET | Freq: Three times a day (TID) | ORAL | Status: DC | PRN

## 2020-02-01 NOTE — Lactation Note (Signed)
This note was copied from a baby's chart. Lactation Consultation Note:  Mother reports that infant spit a large volume. She reports that staff nurse told her to wait until infant began cuing for the next feeding.   Mother reports that she has nothing in her breast.  Assist mother with hand expression. Observed several large drops of colostrum. Advised mother to watch Laverle Hobby' s hand expression video.  Mother reports that she has been unable to get anything with the hand pump or the Symphoney. She is unsure why she can get colostrum with her hands. Explained the effectiveness of hand expression. Advised mother to hand express frequently before and after pumping and breastfeeding.   Reviewed LPI again. Suggested that mother try to get infant to take the advised amount of formula   Mother to breastfeed using the #20 NS and offer formula through the shield . She then is to bottle feed infant as much as she will take.  Mother to page [facility] when she breastfeeds the next feeding.  Mother to post pump for 15 mins after each feeding.    Patient Name: Jacqueline Lynch [id number] Date: 02/01/2020 Reason for consult: Follow-up assessment   Maternal Data Has patient been taught Hand Expression?: Yes(mother reports nothing in breast, obs. lge drops, )  Feeding Feeding Type: (mother to page for [facility] assist with latch)  LATCH Score                   Interventions Interventions: Hand express  Lactation Tools Discussed/Used     Consult Status Consult Status: Follow-up Date: 02/02/20 Follow-up type: In-patient    Stevan Born [facility] 02/01/2020, 2:30 PM

## 2020-02-01 NOTE — Lactation Note (Addendum)
This note was copied from a baby's chart. Lactation Consultation Note  Patient Name: Girl Mallorey Odonell JASNK'N Date: 02/01/2020 Reason for consult: Follow-up assessment  Mom has baby latched on right breast in football on arrival.  Baby chewing on tip of nipple shield  And mom reports she is sleepy. Attempt to reposition infant on left breast in cross cradle hold with nipple shield.  Infant will latch on nipple shield but she is a little chompy. Questionable tight frenulum.   Took nipple shield off and she latches but comes off and on and unable to maintain.Gave close to 10 ml of Neosure infant formula in nipple shield and without nipple shield at breast via syringe.  Discussed using a 5 french at breast but dad says their other child broke his leg so he will not be around to help so did not do this. Discussed bottle usage with parents.Mom reports she is worried about baby not breastfeeding once takes a bottle.  After a few attempts at the breast showed mom how to feed baby via finger and curved tip syringe.  Mom reports she is not sure she can do that.  Urged her to think about bottle feeding her milk and whatever formula needed past breastfeedings and or attempted breastfeed.   RN to make SLP referral. Urged mom to call lactation as needed.    Maternal Data Has patient been taught Hand Expression?: Yes(mother reports nothing in breast, obs. lge drops, )  Feeding Feeding Type: (mother to page for [facility] assist with latch)  LATCH Score                   Interventions Interventions: Hand express  Lactation Tools Discussed/Used     Consult Status Consult Status: Follow-up Date: 02/02/20 Follow-up type: In-patient    Leelynn Whetsel Michaelle Copas 02/01/2020, 3:21 PM

## 2020-02-01 NOTE — Progress Notes (Signed)
Postop Note Day # 1  S:  Patient resting comfortable in bed.  Pain controlled.  Noted some nausea/vomiting overnight- so far for the past couple of hours able to tolerate crackers and sips of water. No flatus, no BM.  Lochia moderate.  Ambulating without difficulty.  No F/C SOB, or CP.  Pt plans on breastfeeding.  O: Temp:  [97.9 F (36.6 C)-98.8 F (37.1 C)] 98.2 F (36.8 C) (05/06 0330) Pulse Rate:  [61-77] 68 (05/06 0330) Resp:  [16-18] 18 (05/06 0330) BP: (100-114)/(55-77) 101/67 (05/06 0330) SpO2:  [94 %-99 %] 99 % (05/05 2115)   Gen: A&Ox3, NAD CV: RRR Resp: CTAB Abdomen: soft, NT, ND +BS Uterus: firm, non-tender, below umbilicus Incision: pressure dressing in place- clean & dry Ext: No edema, no calf tenderness bilaterally, SCDs in place  Labs:  Recent Labs    01/31/20 0355 02/01/20 0538  HGB 11.9* 11.2*    A/P: Pt is a 39 y.o. [id number] s/p repeat C-section, POD#1  - Pain well controlled -GU: UOP is adequate -GI: Continue with zofran as needed, plan to advance diet as tolerate -Activity: encouraged sitting up to chair and ambulation as tolerated -Prophylaxis: SCDs while in bed, early ambulation -Labs: stable as above -Plan to remove pressure dressing today  DISPO: Continue with routine postop care  Myna Hidalgo, DO [phone] (cell) [phone] (office)

## 2020-02-02 NOTE — Plan of Care (Signed)
  Problem: Skin Integrity: Goal: Risk for impaired skin integrity will decrease Outcome: Completed/Met   Problem: Education: Goal: Knowledge of condition will improve Outcome: Completed/Met   Problem: Education: Goal: Individualized Educational Video(s) Outcome: Completed/Met   Problem: Education: Goal: Individualized Newborn Educational Video(s) Outcome: Completed/Met   Problem: Activity: Goal: Will verbalize the importance of balancing activity with adequate rest periods Outcome: Completed/Met   Problem: Activity: Goal: Ability to tolerate increased activity will improve Outcome: Completed/Met   Problem: Coping: Goal: Ability to identify and utilize available resources and services will improve Outcome: Completed/Met

## 2020-02-02 NOTE — Progress Notes (Signed)
Subjective: Postpartum Day 2: Cesarean Delivery Patient reports incisional pain, tolerating PO and no problems voiding.  Repeat headache.. bp is normal   Objective: Vital signs in last 24 hours: Temp:  [98 F (36.7 C)-98.2 F (36.8 C)] 98.2 F (36.8 C) (05/07 0520) Pulse Rate:  [72-79] 74 (05/07 0520) Resp:  [16-17] 17 (05/07 0520) BP: (98-100)/(65-70) 98/65 (05/07 0520) SpO2:  [97 %-98 %] 98 % (05/07 0520)  Physical Exam:  General: alert, cooperative and no distress Lochia: appropriate Uterine Fundus: firm Incision: pt just getting out of shower...  DVT Evaluation: No evidence of DVT seen on physical exam.  Recent Labs    01/31/20 0355 02/01/20 0538  HGB 11.9* 11.2*  HCT 36.1 33.9*    Assessment/Plan: Status post Cesarean section. Doing well postoperatively.  Continue current care Encourage ambulation Pain control. Continue ibuprofen and tylenol scheduled.Marland Kitchen oxycodone for breakthrough pain  Plan discharge home tomorrow .  Gerald Leitz 02/02/2020, 12:39 PM

## 2020-02-02 NOTE — Lactation Note (Signed)
This note was copied from a baby's chart. Lactation Consultation Note:  Mother reports that she pumped 15 ml. Mother very excited about volume. Mother reports that she is using the Dr brown bottle nipple. Mother plans to get more. She is going to breastfeed and then bottle feed. Infant has not had emesis since yesterday.   Mother has a PIS at home.   Discussed treatment and prevention of engorgement.   Mother to continue to cue base feed infant and feed at least 8-12 times or more in 24 hours and advised to allow for cluster feeding infant as needed.   Mother to continue to due STS. Mother is aware of available LC services at [facility], BFSG'S, OP Dept, and phone # for questions or concerns about breastfeeding.  Mother receptive to all teaching and plan of care. Mother want to see OP LC. infor sent to [facility] office.   Patient Name: Jacqueline Lynch Date: 02/02/2020    Maternal Data    Feeding Feeding Type: Breast Fed  LATCH Score                   Interventions    Lactation Tools Discussed/Used     Consult Status      Michel Bickers 02/02/2020, 3:23 PM

## 2020-02-03 MED ORDER — CYCLOBENZAPRINE HCL 5 MG PO TABS
5.0000 mg | ORAL_TABLET | Freq: Three times a day (TID) | ORAL | Status: DC | PRN
  Administered 2020-02-03: 5 mg via ORAL
  Filled 2020-02-03 (×2): qty 1

## 2020-02-03 MED ORDER — IBUPROFEN 800 MG PO TABS: 800.0000 mg | ORAL_TABLET | Freq: Three times a day (TID) | ORAL | 1 refills | Status: DC | PRN

## 2020-02-03 MED ORDER — ACETAMINOPHEN 500 MG PO TABS: 1000.0000 mg | ORAL_TABLET | Freq: Three times a day (TID) | ORAL | 0 refills | Status: DC | PRN

## 2020-02-03 MED ORDER — CYCLOBENZAPRINE HCL 5 MG PO TABS: 5.0000 mg | ORAL_TABLET | Freq: Three times a day (TID) | ORAL | 0 refills | Status: DC | PRN

## 2020-02-03 MED ORDER — CYCLOBENZAPRINE HCL 10 MG PO TABS: 10.0000 mg | ORAL_TABLET | Freq: Three times a day (TID) | ORAL | Status: DC | PRN

## 2020-02-03 MED ORDER — OXYCODONE HCL 5 MG PO TABS: 5.0000 mg | ORAL_TABLET | ORAL | 0 refills | Status: AC | PRN

## 2020-02-03 NOTE — Discharge Summary (Signed)
OB Discharge Summary     Patient Name: Jacqueline Lynch DOB: [birth year] MRN: [id number]  Date of admission: 01/31/2020 Delivering MD: Gerald Leitz   Date of discharge: 02/03/2020  Admitting diagnosis: PROM (premature rupture of membranes) [O42.90] S/P cesarean section [Z98.891] Intrauterine pregnancy: [redacted]w[redacted]d     Secondary diagnosis:  Active Problems:   PROM (premature rupture of membranes)   S/P cesarean section  Additional problems: None     Discharge diagnosis: Preterm Pregnancy Delivered                                                                                                Post partum procedures:None  Augmentation: NA  Complications: None  Hospital course:  Onset of Labor With Unplanned C/S  39 y.o. yo [id number] at [redacted]w[redacted]d was admitted in Latent Labor on 01/31/2020. Patient had a labor course significant for PPROM with h/o cesarean section desires repeat declined TOLAC. Membrane Rupture Time/Date: 1:30 AM ,01/31/2020   The patient went for cesarean section due to Elective Repeat, and delivered a Viable infant,01/31/2020  Details of operation can be found in separate operative note. Patient had an uncomplicated postpartum course.  She is ambulating,tolerating a regular diet, passing flatus, and urinating well.  Patient is discharged home in stable condition 02/03/20.  Physical exam  Vitals:   02/02/20 0520 02/02/20 1420 02/02/20 2105 02/03/20 0523  BP: 98/65 107/67 111/68 111/66  Pulse: 74 72 72 70  Resp: 17 16 16 18   Temp: 98.2 F (36.8 C) 98.2 F (36.8 C) 98.6 F (37 C) 97.8 F (36.6 C)  TempSrc:  Oral Oral Oral  SpO2: 98%  100% 98%  Weight:      Height:       General: alert, cooperative and no distress Lochia: appropriate Uterine Fundus: firm Incision: Healing well with no significant drainage DVT Evaluation: No evidence of DVT seen on physical exam. Labs: Lab Results  Component Value Date   WBC 10.2 02/01/2020   HGB 11.2 (L) 02/01/2020   HCT 33.9 (L) 02/01/2020    MCV 94.4 02/01/2020   PLT 191 02/01/2020   No flowsheet data found.  Discharge instruction: per After Visit Summary and "Baby and Me Booklet".  After visit meds:  Allergies as of 02/03/2020   No Known Allergies     Medication List    TAKE these medications   acetaminophen 500 MG tablet Commonly known as: TYLENOL Take 2 tablets (1,000 mg total) by mouth every 8 (eight) hours as needed.   cyclobenzaprine 5 MG tablet Commonly known as: FLEXERIL Take 1 tablet (5 mg total) by mouth 3 (three) times daily as needed for muscle spasms.   ibuprofen 800 MG tablet Commonly known as: ADVIL Take 1 tablet (800 mg total) by mouth every 8 (eight) hours as needed.   oxyCODONE 5 MG immediate release tablet Commonly known as: Oxy IR/ROXICODONE Take 1-2 tablets (5-10 mg total) by mouth every 4 (four) hours as needed for up to 7 days for moderate pain or severe pain.   prenatal multivitamin Tabs tablet Take 1 tablet by mouth at bedtime.   senna-docusate  8.6-50 MG tablet Commonly known as: Senokot-S Take 2 tablets by mouth daily.       Diet: routine diet  Activity: Advance as tolerated. Pelvic rest for 6 weeks.   Outpatient follow up:2 weeks Follow up Appt:No future appointments. Follow up Visit:No follow-ups on file.  Postpartum contraception: Abstinence  Newborn Data: Live born female  Birth Weight: 5 lb 1.8 oz (2320 g) APGAR: 6, 9  Newborn Delivery   Birth date/time: 01/31/2020 11:45:00 Delivery type: C-Section, Low Transverse Trial of labor: No C-section categorization: Repeat      Baby Feeding: Bottle and Breast Disposition:rooming in   02/03/2020 Christophe Louis, MD

## 2020-02-03 NOTE — Lactation Note (Signed)
This note was copied from a baby's chart. Lactation Consultation Note  Patient Name: Jacqueline Lynch [id number] Date: 02/03/2020 Reason for consult: Follow-up assessment Baby 68hrs old, LPTI. Mom sitting in bed pumping with DEBP, baby asleep in bed next to mother, reports baby last fed at 550am took 56ml formula did not latch to breast because baby fell asleep. While LC in room mom pumped 13ml transitional milk. Mom unsure if to continue to give formula. Advised continue to follow supplementation guideline and LPTI sheet. Continue to wake baby for feeds until pediatrician advises otherwise. Mom reports sometimes with difficulty latching baby, seems baby prefers bottle over breast. Discussed flow preference and reinforced paced bottle feeding, advised breast compressions when baby latched, switch to opposite breast when suck pattern changes/ baby gets fussy. Advised each feeding at breast is a learning experience. Discussed engorgement and how to manage. Mom unsure if pediatric office has in-house LC. [facility] brochure given, advised to call for support and/ or outpatient support as needed. Advised of support groups. Provided comfort gels per mom's request. Ensured pump flange correct size. Reinforced cleaning of pump parts. Provided larger milk collection bottles for pump.  Plan Continue to feed when baby shows hunger cues. Offer EBM first per supplementation guideline, then latch baby to breast. Continue to limit total feed to . If greater than 3hrs since last feeding wake baby.  Mom advised at next feeding offer EBM first then latch baby, call if needs LC support. Mom advised now with volume ok to supplement with EBM only vs formula, if with concerns discuss with peds. Mom voiced understanding and with no further concerns. BGilliam, RN, IBCLC             Interventions    Lactation Tools Discussed/Used Pump Review: Setup, frequency, and cleaning   Consult Status Consult  Status: Complete Date: 02/03/20    Jacqueline Lynch 02/03/2020, 8:44 AM

## 2020-02-04 ENCOUNTER — Ambulatory Visit: Payer: Self-pay

## 2020-02-04 NOTE — Lactation Note (Signed)
This note was copied from a baby's chart. Lactation Consultation Note  Patient Name: Jacqueline Lynch [id number] Date: 02/04/2020 Reason for consult: Follow-up assessment;Infant < 6lbs;Late-preterm 34-36.6wks  Visited with P2 Mom of LPTI at 49 hrs old. Babu at 1.9% weight loss with 5 voids and 5 transitioning stools last 24 hrs.  Baby has been latching to breast using the 20 mm nipple shield, Mom reports baby will take a few attempts to latch, but then get into a rhythmic suck/swallow pattern.  Mom knows to limit baby to 30 mins at this time, to avoid overtiring infant.  Baby just fed at breast per Mom for 25 mins.  Baby swaddled and sleeping, but started crying.    Talked to Mom about importance of regular supplementation after breastfeeding baby.  Collected 30 ml of EBM into bottle to warm to feed baby.  Reassured Mom that baby needs the supplement temporarily due to being a month early and 5 lbs.    Mom has consistently been double pumping, last session she expressed >60 ml.  Mom has a Medela DEBP at home.   Recommended having OP lactation follow-up.  Recommended baby continue to breastfeed, supplement per LPTI guidelines and Mom double pump.  Mom agreeable to follow-up, message sent to clinic.  Engorgement prevention and treatment reviewed.  Encourage keeping baby STS as much as possible. Mom aware of OP lactation support.  Encouraged to call prn.   Interventions Interventions: Breast feeding basics reviewed;Skin to skin;Breast massage;Hand express;DEBP  Lactation Tools Discussed/Used Tools: Pump;Nipple Dorris Carnes;Bottle;Shells Shell Type: Inverted Breast pump type: Double-Electric Breast Pump   Consult Status Consult Status: Complete Date: 02/04/20 Follow-up type: Out-patient(sent message to OP clinic)    Judee Clara 02/04/2020, 9:24 AM

## 2020-02-20 ENCOUNTER — Inpatient Hospital Stay (HOSPITAL_COMMUNITY): Admit: 2020-02-20 | Payer: 59 | Admitting: Obstetrics and Gynecology

## 2021-06-17 LAB — OB RESULTS CONSOLE RUBELLA ANTIBODY, IGM: Rubella: IMMUNE

## 2021-06-17 LAB — OB RESULTS CONSOLE HEPATITIS B SURFACE ANTIGEN: Hepatitis B Surface Ag: NEGATIVE

## 2021-06-17 LAB — OB RESULTS CONSOLE ABO/RH: RH Type: POSITIVE

## 2021-06-17 LAB — OB RESULTS CONSOLE HIV ANTIBODY (ROUTINE TESTING): HIV: NONREACTIVE

## 2021-06-17 LAB — HEPATITIS C ANTIBODY: HCV Ab: NEGATIVE

## 2021-06-25 LAB — OB RESULTS CONSOLE GC/CHLAMYDIA
Chlamydia: NEGATIVE
Gonorrhea: NEGATIVE

## 2021-10-21 LAB — OB RESULTS CONSOLE RPR: RPR: NONREACTIVE

## 2021-11-09 ENCOUNTER — Inpatient Hospital Stay (HOSPITAL_COMMUNITY)
Admission: AD | Admit: 2021-11-09 | Discharge: 2021-11-09 | Disposition: A | Discharge status: Home / Self Care | Payer: Medicaid Other | Attending: Obstetrics and Gynecology | Admitting: Obstetrics and Gynecology

## 2021-11-09 ENCOUNTER — Other Ambulatory Visit: Payer: Self-pay

## 2021-11-09 ENCOUNTER — Encounter (HOSPITAL_COMMUNITY): Payer: Self-pay | Admitting: Obstetrics and Gynecology

## 2021-11-09 DIAGNOSIS — Z3A29 29 weeks gestation of pregnancy: Secondary | ICD-10-CM | POA: Diagnosis not present

## 2021-11-09 DIAGNOSIS — O09523 Supervision of elderly multigravida, third trimester: Secondary | ICD-10-CM | POA: Insufficient documentation

## 2021-11-09 DIAGNOSIS — O34219 Maternal care for unspecified type scar from previous cesarean delivery: Secondary | ICD-10-CM | POA: Diagnosis not present

## 2021-11-09 DIAGNOSIS — O09213 Supervision of pregnancy with history of pre-term labor, third trimester: Secondary | ICD-10-CM | POA: Insufficient documentation

## 2021-11-09 DIAGNOSIS — Z3689 Encounter for other specified antenatal screening: Secondary | ICD-10-CM | POA: Diagnosis not present

## 2021-11-09 DIAGNOSIS — N858 Other specified noninflammatory disorders of uterus: Secondary | ICD-10-CM | POA: Diagnosis not present

## 2021-11-09 DIAGNOSIS — O368130 Decreased fetal movements, third trimester, not applicable or unspecified: Secondary | ICD-10-CM | POA: Diagnosis not present

## 2021-11-09 DIAGNOSIS — O368190 Decreased fetal movements, unspecified trimester, not applicable or unspecified: Secondary | ICD-10-CM

## 2021-11-09 NOTE — MAU Provider Note (Signed)
History     CSN: [id number]  Arrival date and time: 11/09/21 1118   Event Date/Time   First Provider Initiated Contact with Patient 11/09/21 1200      Chief Complaint  Patient presents with   Decreased Fetal Movement   41 y.o. [id number] @29 .4 wks presenting with decreased FM. Reports less FM than usual after she woke today. She had felt 1 small movement at home. Reports more FM after arrival while in the waiting room. She is eating and drinking well, had breakfast today. Denies other pregnancy complaints.    OB History     Gravida  4   Para  2   Term  1   Preterm  1   AB  1   Living  2      SAB  1   IAB      Ectopic      Multiple  0   Live Births  2           Past Medical History:  Diagnosis Date   Medical history non-contributory     Past Surgical History:  Procedure Laterality Date   CESAREAN SECTION N/A 12/28/2013   Procedure: Primary Cesarean Section Delivery Baby Boy @ 2109, Apgars 8/9;  Surgeon: 10/9. Dorien Chihuahua, MD;  Location: [facility];  Service: Obstetrics;  Laterality: N/A;   CESAREAN SECTION N/A 01/31/2020   Procedure: CESAREAN SECTION;  Surgeon: 04/01/2020, MD;  Location: [facility];  Service: Obstetrics;  Laterality: N/A;   NO PAST SURGERIES      Family History  Problem Relation Age of Onset   Alcohol abuse Neg Hx    Arthritis Neg Hx    Asthma Neg Hx    Birth defects Neg Hx    Cancer Neg Hx    COPD Neg Hx    Depression Neg Hx    Diabetes Neg Hx    Drug abuse Neg Hx    Early death Neg Hx    Hearing loss Neg Hx    Heart disease Neg Hx    Hyperlipidemia Neg Hx    Hypertension Neg Hx    Kidney disease Neg Hx    Learning disabilities Neg Hx    Mental illness Neg Hx    Mental retardation Neg Hx    Miscarriages / Stillbirths Neg Hx    Stroke Neg Hx    Vision loss Neg Hx     Social History   Tobacco Use   Smoking status: Never   Smokeless tobacco: Never  Vaping Use   Vaping Use: Never used  Substance Use Topics   Alcohol use: No    Drug use: No    Allergies: No Known Allergies  Medications Prior to Admission  Medication Sig Dispense Refill Last Dose   acetaminophen (TYLENOL) 500 MG tablet Take 2 tablets (1,000 mg total) by mouth every 8 (eight) hours as needed. 30 tablet 0    cyclobenzaprine (FLEXERIL) 5 MG tablet Take 1 tablet (5 mg total) by mouth 3 (three) times daily as needed for muscle spasms. 30 tablet 0    ibuprofen (ADVIL) 800 MG tablet Take 1 tablet (800 mg total) by mouth every 8 (eight) hours as needed. 30 tablet 1    Prenatal Vit-Fe Fumarate-FA (PRENATAL MULTIVITAMIN) TABS tablet Take 1 tablet by mouth at bedtime.      senna-docusate (SENOKOT-S) 8.6-50 MG per tablet Take 2 tablets by mouth daily. (Patient not taking: Reported on 02/01/2020) 30 tablet 0  Review of Systems  Gastrointestinal:  Negative for abdominal pain.  Genitourinary:  Negative for vaginal bleeding and vaginal discharge.  Physical Exam   Blood pressure 105/66, pulse 96, temperature 98.6 F (37 C), temperature source Oral, resp. rate 17, weight 73.8 kg, SpO2 99 %, unknown if currently breastfeeding.  Physical Exam Vitals and nursing note reviewed.  Constitutional:      General: She is not in acute distress.    Appearance: Normal appearance.  HENT:     Head: Normocephalic and atraumatic.  Cardiovascular:     Rate and Rhythm: Normal rate.  Pulmonary:     Effort: Pulmonary effort is normal. No respiratory distress.  Musculoskeletal:        General: Normal range of motion.     Cervical back: Normal range of motion.  Neurological:     General: No focal deficit present.     Mental Status: She is alert and oriented to person, place, and time.  Psychiatric:        Mood and Affect: Mood normal.        Behavior: Behavior normal.  EFM: 145 bpm, mod variability, + accels, no decels Toco: none  No results found for this or any previous visit (from the past 24 hour(s)).  MAU Course  Procedures  MDM Chart reviewed: pregnancy  complicated by AMA, previous CS x2, and PTB @36  wks. Reports feeling normal FM since EFM applied. NST reactive, FM marked 10 times in first 20 min of monitoring.  Prolonged monitoring is reassuring. Reports good FM throughout. Stable for discharge home.   Assessment and Plan   1. [redacted] weeks gestation of pregnancy   2. NST (non-stress test) reactive    Discharge home Follow up at [facility] tomorrow as scheduled [facility]  Allergies as of 11/09/2021   No Known Allergies      Medication List     STOP taking these medications    ibuprofen 800 MG tablet Commonly known as: ADVIL   senna-docusate 8.6-50 MG tablet Commonly known as: Senokot-S       TAKE these medications    acetaminophen 500 MG tablet Commonly known as: TYLENOL Take 2 tablets (1,000 mg total) by mouth every 8 (eight) hours as needed.   cyclobenzaprine 5 MG tablet Commonly known as: FLEXERIL Take 1 tablet (5 mg total) by mouth 3 (three) times daily as needed for muscle spasms.   prenatal multivitamin Tabs tablet Take 1 tablet by mouth at bedtime.       01-04-1977, CNM 11/09/2021, 12:11 PM

## 2021-11-09 NOTE — MAU Note (Signed)
Noted decrease in fetal movement since last night.  Is moving, just not as much as usual. Denies pain, bleeding or LOF.

## 2021-12-23 LAB — OB RESULTS CONSOLE GBS: GBS: NEGATIVE

## 2021-12-31 NOTE — Patient Instructions (Addendum)
Tod Persia ? 12/31/2021 ? ? Your procedure is scheduled on:  01/14/2022 ? Arrive at [employer] at [employer] on [address] at [facility]  and [address]. You are invited to use the FREE valet parking or use the Visitor's parking deck. ? Pick up the phone at the desk and dial [phone]. ? Call this number if you have problems the morning of surgery: [phone] ? Remember: ? ? Do not eat food:(After Midnight) Desp?s de medianoche. ? Do not drink clear liquids: (After Midnight) Desp?s de medianoche. ? Take these medicines the morning of surgery with A SIP OF WATER:  none ? ? Do not wear jewelry, make-up or nail polish. ? Do not wear lotions, powders, or perfumes. Do not wear deodorant. ? Do not shave 48 hours prior to surgery. ? Do not bring valuables to the hospital.  [facility] is not  ? responsible for any belongings or valuables brought to the hospital. ? Contacts, dentures or bridgework may not be worn into surgery. ? Leave suitcase in the car. After surgery it may be brought to your room. ? For patients admitted to the hospital, checkout time is 11:00 AM the day of  ?            discharge. ? ?   ? Please read over the following fact sheets that you were given:  ?   Preparing for Surgery ? ? ?

## 2022-01-01 ENCOUNTER — Telehealth (HOSPITAL_COMMUNITY): Payer: Self-pay | Admitting: *Deleted

## 2022-01-01 NOTE — Telephone Encounter (Signed)
Preadmission screen  

## 2022-01-02 ENCOUNTER — Telehealth (HOSPITAL_COMMUNITY): Payer: Self-pay | Admitting: *Deleted

## 2022-01-02 NOTE — Telephone Encounter (Signed)
Preadmission screen  

## 2022-01-05 ENCOUNTER — Encounter (HOSPITAL_COMMUNITY): Payer: Self-pay

## 2022-01-06 ENCOUNTER — Inpatient Hospital Stay (HOSPITAL_COMMUNITY)
Admission: AD | Admit: 2022-01-06 | Discharge: 2022-01-06 | Disposition: A | Discharge status: Home / Self Care | Payer: Medicaid Other | Attending: Obstetrics and Gynecology | Admitting: Obstetrics and Gynecology

## 2022-01-06 ENCOUNTER — Other Ambulatory Visit: Payer: Self-pay

## 2022-01-06 ENCOUNTER — Encounter (HOSPITAL_COMMUNITY): Payer: Self-pay | Admitting: Family Medicine

## 2022-01-06 DIAGNOSIS — O368130 Decreased fetal movements, third trimester, not applicable or unspecified: Secondary | ICD-10-CM | POA: Diagnosis present

## 2022-01-06 DIAGNOSIS — O09523 Supervision of elderly multigravida, third trimester: Secondary | ICD-10-CM | POA: Insufficient documentation

## 2022-01-06 DIAGNOSIS — Z3493 Encounter for supervision of normal pregnancy, unspecified, third trimester: Secondary | ICD-10-CM

## 2022-01-06 DIAGNOSIS — Z3A37 37 weeks gestation of pregnancy: Secondary | ICD-10-CM | POA: Diagnosis not present

## 2022-01-06 DIAGNOSIS — Z3689 Encounter for other specified antenatal screening: Secondary | ICD-10-CM

## 2022-01-06 NOTE — MAU Note (Signed)
.  Jacqueline Lynch is a 41 y.o. at [redacted]w[redacted]d here in MAU reporting: she has not felt as much movement as she normally does. Reports some small movements but was worrying her. Denies bleeding , ROM, or contractions.  ?Onset of complaint: today ?Pain score: 0 ?Vitals:  ? 01/06/22 2112  ?BP: 116/71  ?Pulse: 100  ?Resp: 15  ?Temp: 99.1 ?F (37.3 ?C)  ?SpO2: 99%  ?   ?FHT:155 ?Lab orders placed from triage:   ? ?

## 2022-01-06 NOTE — Discharge Instructions (Signed)

## 2022-01-06 NOTE — MAU Provider Note (Signed)
?History  ?  ? ?CSN: [id number] ? ?Arrival date and time: 01/06/22 2051 ? ? Event Date/Time  ? First Provider Initiated Contact with Patient 01/06/22 2121   ?  ? ?Chief Complaint  ?Patient presents with  ? Decreased Fetal Movement  ? ?HPI ?Jacqueline Lynch is a 41 y.o. [id number] at 956w6d who presents with decreased fetal movement. She states she feels like the baby hasn't been moving normally today. She reports she has felt the normal amount of movements today but they haven't felt as vigorous as they normally do. She denies any abdominal pain, vaginal bleeding or discharge. Since her arrival to MAU, she is feeling normal fetal movement. She reports she just wanted to be sure everything was ok.  ? ?OB History   ? ? Gravida  ?4  ? Para  ?2  ? Term  ?1  ? Preterm  ?1  ? AB  ?1  ? Living  ?2  ?  ? ? SAB  ?1  ? IAB  ?   ? Ectopic  ?   ? Multiple  ?0  ? Live Births  ?2  ?   ?  ?  ? ? ?Past Medical History:  ?Diagnosis Date  ? Medical history non-contributory   ? ? ?Past Surgical History:  ?Procedure Laterality Date  ? CESAREAN SECTION N/A 12/28/2013  ? Procedure: Primary Cesarean Section Delivery Baby Boy @ 2109, Apgars 8/9;  Surgeon: Dorien Chihuahuaara J. Richardson Doppole, MD;  Location: [facility];  Service: Obstetrics;  Laterality: N/A;  ? CESAREAN SECTION N/A 01/31/2020  ? Procedure: CESAREAN SECTION;  Surgeon: Gerald Leitzole, Tara, MD;  [facility];  Service: Obstetrics;  Laterality: N/A;  ? NO PAST SURGERIES    ? ? ?Family History  ?Problem Relation Age of Onset  ? Alcohol abuse Neg Hx   ? Arthritis Neg Hx   ? Asthma Neg Hx   ? Birth defects Neg Hx   ? Cancer Neg Hx   ? COPD Neg Hx   ? Depression Neg Hx   ? Diabetes Neg Hx   ? Drug abuse Neg Hx   ? Early death Neg Hx   ? Hearing loss Neg Hx   ? Heart disease Neg Hx   ? Hyperlipidemia Neg Hx   ? Hypertension Neg Hx   ? Kidney disease Neg Hx   ? Learning disabilities Neg Hx   ? Mental illness Neg Hx   ? Mental retardation Neg Hx   ? Miscarriages / Stillbirths Neg Hx   ? Stroke Neg Hx   ? Vision loss Neg Hx    ? ? ?Social History  ? ?Tobacco Use  ? Smoking status: Never  ? Smokeless tobacco: Never  ?Vaping Use  ? Vaping Use: Never used  ?Substance Use Topics  ? Alcohol use: No  ? Drug use: No  ? ? ?Allergies: No Known Allergies ? ?Medications Prior to Admission  ?Medication Sig Dispense Refill Last Dose  ? Prenatal Vit-Fe Fumarate-FA (PRENATAL MULTIVITAMIN) TABS tablet Take 1 tablet by mouth at bedtime.   01/05/2022  ? acetaminophen (TYLENOL) 500 MG tablet Take 2 tablets (1,000 mg total) by mouth every 8 (eight) hours as needed. 30 tablet 0   ? cyclobenzaprine (FLEXERIL) 5 MG tablet Take 1 tablet (5 mg total) by mouth 3 (three) times daily as needed for muscle spasms. 30 tablet 0   ? ? ?Review of Systems  ?Constitutional: Negative.  Negative for fatigue and fever.  ?HENT: Negative.    ?Respiratory:  Negative.  Negative for shortness of breath.   ?Cardiovascular: Negative.  Negative for chest pain.  ?Gastrointestinal: Negative.  Negative for abdominal pain, constipation, diarrhea, nausea and vomiting.  ?Genitourinary: Negative.  Negative for dysuria, vaginal bleeding and vaginal discharge.  ?Neurological: Negative.  Negative for dizziness and headaches.  ?Physical Exam  ? ?Blood pressure 116/71, pulse 100, temperature 99.1 ?F (37.3 ?C), resp. rate 15, SpO2 99 %, unknown if currently breastfeeding. ? ?Physical Exam ?Vitals and nursing note reviewed.  ?Constitutional:   ?   General: She is not in acute distress. ?   Appearance: She is well-developed.  ?HENT:  ?   Head: Normocephalic.  ?Eyes:  ?   Pupils: Pupils are equal, round, and reactive to light.  ?Cardiovascular:  ?   Rate and Rhythm: Normal rate and regular rhythm.  ?   Heart sounds: Normal heart sounds.  ?Pulmonary:  ?   Effort: Pulmonary effort is normal. No respiratory distress.  ?   Breath sounds: Normal breath sounds.  ?Abdominal:  ?   General: Bowel sounds are normal. There is no distension.  ?   Palpations: Abdomen is soft.  ?   Tenderness: There is no abdominal  tenderness.  ?Skin: ?   General: Skin is warm and dry.  ?Neurological:  ?   Mental Status: She is alert and oriented to person, place, and time.  ?Psychiatric:     ?   Mood and Affect: Mood normal.     ?   Behavior: Behavior normal.     ?   Thought Content: Thought content normal.     ?   Judgment: Judgment normal.  ? ?Fetal Tracing: ? ?Baseline: 145 ?Variability: moderate ?Accels: 15x15 ?Decels: none ? ?Toco: occasional uc's, patient not feeling ? ? ?MAU Course  ?Procedures ? ?MDM ?NST reactive ?Patient reports normal fetal movement.  ?Indicated feeling fetal movement 14 times in 30 minutes ? ?Assessment and Plan  ? ?1. NST (non-stress test) reactive   ?2. Movement of fetus present during pregnancy in third trimester   ?3. [redacted] weeks gestation of pregnancy   ? ?-Discharge home in stable condition ?-Fetal movement precautions discussed ?-Patient advised to follow-up with OB as scheduled for prenatal care ?-Patient may return to MAU as needed or if her condition were to change or worsen ? ? ?Wende Mott CNM ?01/06/2022, 9:21 PM  ?

## 2022-01-12 ENCOUNTER — Encounter (HOSPITAL_COMMUNITY)
Admission: RE | Admit: 2022-01-12 | Discharge: 2022-01-12 | Disposition: A | Discharge status: Home / Self Care | Payer: Medicaid Other | Source: Ambulatory Visit | Attending: Family Medicine | Admitting: Family Medicine

## 2022-01-12 ENCOUNTER — Other Ambulatory Visit: Payer: Self-pay | Admitting: Obstetrics and Gynecology

## 2022-01-12 DIAGNOSIS — Z01812 Encounter for preprocedural laboratory examination: Secondary | ICD-10-CM | POA: Diagnosis present

## 2022-01-12 DIAGNOSIS — O34219 Maternal care for unspecified type scar from previous cesarean delivery: Secondary | ICD-10-CM | POA: Insufficient documentation

## 2022-01-12 LAB — CBC
HCT: 31.6 % — ABNORMAL LOW (ref 36.0–46.0)
Hemoglobin: 10 g/dL — ABNORMAL LOW (ref 12.0–15.0)
MCH: 27 pg (ref 26.0–34.0)
MCHC: 31.6 g/dL (ref 30.0–36.0)
MCV: 85.4 fL (ref 80.0–100.0)
Platelets: 222 10*3/uL (ref 150–400)
RBC: 3.7 MIL/uL — ABNORMAL LOW (ref 3.87–5.11)
RDW: 13.9 % (ref 11.5–15.5)
WBC: 7 10*3/uL (ref 4.0–10.5)
nRBC: 0 % (ref 0.0–0.2)

## 2022-01-12 LAB — TYPE AND SCREEN
ABO/RH(D): O POS
Antibody Screen: NEGATIVE

## 2022-01-13 ENCOUNTER — Other Ambulatory Visit: Payer: Self-pay | Admitting: Obstetrics and Gynecology

## 2022-01-13 LAB — RPR: RPR Ser Ql: NONREACTIVE

## 2022-01-13 NOTE — H&P (Deleted)
  The note originally documented on this encounter has been moved the the encounter in which it belongs.  

## 2022-01-13 NOTE — H&P (Signed)
Jacqueline Lynch is a 41 y.o. female 39 weeks and 0 days presenting for repeat cesarean section and bilateral salpingectomy. ?Pregnancy complicated by shortened cervix  and advanced maternal age.  ? ?Prenatal care provided by Dr. Christophe Louis with [facility] ? ?OB History   ? ? Gravida  ?4  ? Para  ?2  ? Term  ?1  ? Preterm  ?1  ? AB  ?1  ? Living  ?2  ?  ? ? SAB  ?1  ? IAB  ?   ? Ectopic  ?   ? Multiple  ?0  ? Live Births  ?2  ?   ?  ?  ? ?Past Medical History:  ?Diagnosis Date  ? Medical history non-contributory   ? ?Past Surgical History:  ?Procedure Laterality Date  ? CESAREAN SECTION N/A 12/28/2013  ? Procedure: Primary Cesarean Section Delivery Baby Boy @ 2109, Apgars 8/9;  Surgeon: Maeola Sarah. Landry Mellow, MD;  Location: [facility];  Service: Obstetrics;  Laterality: N/A;  ? CESAREAN SECTION N/A 01/31/2020  ? Procedure: CESAREAN SECTION;  Surgeon: Christophe Louis, MD;  Location: [facility] LD ORS;  Service: Obstetrics;  Laterality: N/A;  ? NO PAST SURGERIES    ? ?Family History: family history is not on file. ?Social History:  reports that she has never smoked. She has never used smokeless tobacco. She reports that she does not drink alcohol and does not use drugs. ? ? ?  ?Maternal Diabetes: No ?Genetic Screening: Declined ?Maternal Ultrasounds/Referrals: Normal ?Fetal Ultrasounds or other Referrals:  None ?Maternal Substance Abuse:  No ?Significant Maternal Medications:  None ?Significant Maternal Lab Results:  Group B Strep negative ?Other Comments:  None ? ?Review of Systems  ?Constitutional: Negative.   ?HENT: Negative.    ?Eyes: Negative.   ?Respiratory: Negative.    ?Cardiovascular: Negative.   ?Gastrointestinal: Negative.   ?Endocrine: Negative.   ?Genitourinary: Negative.   ?Musculoskeletal: Negative.   ?Skin: Negative.   ?Allergic/Immunologic: Negative.   ?Neurological: Negative.   ?Hematological: Negative.   ?Psychiatric/Behavioral: Negative.    ?History ?  ?unknown if currently breastfeeding. ?Maternal Exam:  ?Introitus: Normal  vulva.  ?Physical Exam ?Vitals reviewed.  ?Constitutional:   ?   Appearance: Normal appearance.  ?HENT:  ?   Head: Normocephalic.  ?   Nose: Nose normal.  ?   Mouth/Throat:  ?   Mouth: Mucous membranes are moist.  ?Eyes:  ?   Pupils: Pupils are equal, round, and reactive to light.  ?Cardiovascular:  ?   Rate and Rhythm: Normal rate and regular rhythm.  ?   Pulses: Normal pulses.  ?   Heart sounds: Normal heart sounds.  ?Pulmonary:  ?   Effort: Pulmonary effort is normal.  ?   Breath sounds: Normal breath sounds.  ?Abdominal:  ?   Tenderness: There is no abdominal tenderness.  ?Genitourinary: ?   General: Normal vulva.  ?   Comments: Cervix 2/75/-1 ?Musculoskeletal:     ?   General: Swelling present.  ?   Cervical back: Normal range of motion and neck supple.  ?Skin: ?   General: Skin is warm and dry.  ?Neurological:  ?   General: No focal deficit present.  ?   Mental Status: She is alert and oriented to person, place, and time.  ?Psychiatric:     ?   Mood and Affect: Mood normal.     ?   Behavior: Behavior normal.  ?  ?Prenatal labs: ?ABO, Rh: --/--/O POS (04/17 1050) ?Antibody:  NEG (04/17 1050) ?Rubella: Immune (09/20 0000) ?RPR: NON REACTIVE (04/17 1049)  ?HBsAg: Negative (09/20 0000)  ?HIV: Non-reactive (09/20 0000)  ?GBS: Negative/-- (03/28 0000)  ? ?Assessment/Plan: ?39 weeks and 0 days presents for repeat cesarean section. And bilateral salpingectomy. ..  R/B/A of cesarean section discussed with the patient including but not limited to infection, bleeding damage to bowel bladder and baby with the need for further surgery. R/O transfusion HIV/ Hep B&C discussed. Pt voiced understanding and desires to proceed with cesarean section.   ? ?She is aware that  bilateral salpingectomy is considered a permanent form of sterilization.  And desires to proceed.   ? ?Christophe Louis ?01/13/2022, 6:19 PM ? ? ? ? ?

## 2022-01-13 NOTE — Anesthesia Preprocedure Evaluation (Addendum)
Anesthesia Evaluation  ?Patient identified by MRN, date of birth, ID band ?Patient awake ? ? ? ?Reviewed: ?Allergy & Precautions, NPO status , Patient's Chart, lab work & pertinent test results ? ?History of Anesthesia Complications ?Negative for: history of anesthetic complications ? ?Airway ?Mallampati: II ? ?TM Distance: >3 FB ?Neck ROM: Full ? ? ? Dental ? ?(+) Missing,  ?  ?Pulmonary ?neg pulmonary ROS,  ?  ?Pulmonary exam normal ? ? ? ? ? ? ? Cardiovascular ?negative cardio ROS ?Normal cardiovascular exam ? ? ?  ?Neuro/Psych ?negative neurological ROS ? negative psych ROS  ? GI/Hepatic ?negative GI ROS, Neg liver ROS,   ?Endo/Other  ?negative endocrine ROS ? Renal/GU ?negative Renal ROS  ?negative genitourinary ?  ?Musculoskeletal ?negative musculoskeletal ROS ?(+)  ? Abdominal ?  ?Peds ? Hematology ? ?(+) Blood dyscrasia, anemia , Hgb 10.0   ?Anesthesia Other Findings ?Day of surgery medications reviewed with patient. ? Reproductive/Obstetrics ?(+) Pregnancy (Hx of C/S x2) ? ?  ? ? ? ? ? ? ? ? ? ? ? ? ? ?  ?  ? ? ? ? ? ? ? ?Anesthesia Physical ?Anesthesia Plan ? ?ASA: 2 ? ?Anesthesia Plan: Spinal  ? ?Post-op Pain Management: Tylenol PO (pre-op)*  ? ?Induction:  ? ?PONV Risk Score and Plan: 4 or greater and Treatment may vary due to age or medical condition, Ondansetron and Dexamethasone ? ?Airway Management Planned: Natural Airway ? ?Additional Equipment: None ? ?Intra-op Plan:  ? ?Post-operative Plan:  ? ?Informed Consent: I have reviewed the patients History and Physical, chart, labs and discussed the procedure including the risks, benefits and alternatives for the proposed anesthesia with the patient or authorized representative who has indicated his/her understanding and acceptance.  ? ? ? ? ? ?Plan Discussed with: CRNA ? ?Anesthesia Plan Comments:   ? ? ? ? ? ?Anesthesia Quick Evaluation ? ?

## 2022-01-14 ENCOUNTER — Inpatient Hospital Stay (HOSPITAL_COMMUNITY)
Admission: RE | Admit: 2022-01-14 | Discharge: 2022-01-17 | DRG: 784 | Disposition: A | Discharge status: Home / Self Care | Payer: Medicaid Other | Source: Ambulatory Visit | Attending: Obstetrics and Gynecology | Admitting: Obstetrics and Gynecology

## 2022-01-14 ENCOUNTER — Other Ambulatory Visit: Payer: Self-pay

## 2022-01-14 ENCOUNTER — Encounter (HOSPITAL_COMMUNITY)
Admission: RE | Disposition: A | Discharge status: Home / Self Care | Payer: Self-pay | Source: Ambulatory Visit | Attending: Obstetrics and Gynecology

## 2022-01-14 ENCOUNTER — Encounter (HOSPITAL_COMMUNITY): Payer: Self-pay | Admitting: Obstetrics and Gynecology

## 2022-01-14 ENCOUNTER — Inpatient Hospital Stay (HOSPITAL_COMMUNITY): Payer: Medicaid Other | Admitting: Anesthesiology

## 2022-01-14 DIAGNOSIS — O9902 Anemia complicating childbirth: Secondary | ICD-10-CM

## 2022-01-14 DIAGNOSIS — Z23 Encounter for immunization: Secondary | ICD-10-CM | POA: Diagnosis not present

## 2022-01-14 DIAGNOSIS — O34219 Maternal care for unspecified type scar from previous cesarean delivery: Principal | ICD-10-CM | POA: Diagnosis present

## 2022-01-14 DIAGNOSIS — Z3A39 39 weeks gestation of pregnancy: Secondary | ICD-10-CM

## 2022-01-14 DIAGNOSIS — O26873 Cervical shortening, third trimester: Secondary | ICD-10-CM | POA: Diagnosis present

## 2022-01-14 DIAGNOSIS — D649 Anemia, unspecified: Secondary | ICD-10-CM

## 2022-01-14 DIAGNOSIS — Z98891 History of uterine scar from previous surgery: Principal | ICD-10-CM

## 2022-01-14 SURGERY — Surgical Case
Anesthesia: Spinal | Site: Abdomen | Laterality: Bilateral | Wound class: Clean Contaminated

## 2022-01-14 MED ORDER — DIBUCAINE (PERIANAL) 1 % EX OINT: 1.0000 "application " | TOPICAL_OINTMENT | CUTANEOUS | Status: DC | PRN

## 2022-01-14 MED ORDER — IBUPROFEN 600 MG PO TABS
600.0000 mg | ORAL_TABLET | Freq: Four times a day (QID) | ORAL | Status: AC
  Administered 2022-01-14 – 2022-01-17 (×11): 600 mg via ORAL
  Filled 2022-01-14 (×13): qty 1

## 2022-01-14 MED ORDER — ACETAMINOPHEN 500 MG PO TABS
1000.0000 mg | ORAL_TABLET | ORAL | Status: AC
  Administered 2022-01-14: 1000 mg via ORAL

## 2022-01-14 MED ORDER — HYDROMORPHONE HCL 1 MG/ML IJ SOLN: 0.2000 mg | INTRAMUSCULAR | Status: DC | PRN

## 2022-01-14 MED ORDER — OXYCODONE HCL 5 MG PO TABS: 5.0000 mg | ORAL_TABLET | ORAL | Status: DC | PRN

## 2022-01-14 MED ORDER — STERILE WATER FOR IRRIGATION IR SOLN
Status: DC | PRN
  Administered 2022-01-14: 1000 mL

## 2022-01-14 MED ORDER — MENTHOL 3 MG MT LOZG: 1.0000 | LOZENGE | OROMUCOSAL | Status: DC | PRN

## 2022-01-14 MED ORDER — DEXAMETHASONE SODIUM PHOSPHATE 4 MG/ML IJ SOLN
INTRAMUSCULAR | Status: AC
  Filled 2022-01-14: qty 1

## 2022-01-14 MED ORDER — FERROUS SULFATE 325 (65 FE) MG PO TABS
325.0000 mg | ORAL_TABLET | Freq: Two times a day (BID) | ORAL | Status: DC
  Administered 2022-01-15 – 2022-01-17 (×5): 325 mg via ORAL
  Filled 2022-01-14 (×5): qty 1

## 2022-01-14 MED ORDER — SIMETHICONE 80 MG PO CHEW
80.0000 mg | CHEWABLE_TABLET | ORAL | Status: DC | PRN
Start: 2022-01-14 — End: 2022-01-17

## 2022-01-14 MED ORDER — ACETAMINOPHEN 500 MG PO TABS
1000.0000 mg | ORAL_TABLET | Freq: Four times a day (QID) | ORAL | Status: DC
  Administered 2022-01-14 – 2022-01-17 (×9): 1000 mg via ORAL
  Filled 2022-01-14 (×9): qty 2

## 2022-01-14 MED ORDER — FENTANYL CITRATE (PF) 100 MCG/2ML IJ SOLN: 25.0000 ug | INTRAMUSCULAR | Status: DC | PRN

## 2022-01-14 MED ORDER — OXYTOCIN-SODIUM CHLORIDE 30-0.9 UT/500ML-% IV SOLN
INTRAVENOUS | Status: AC
  Filled 2022-01-14: qty 500

## 2022-01-14 MED ORDER — MORPHINE SULFATE (PF) 0.5 MG/ML IJ SOLN
INTRAMUSCULAR | Status: DC | PRN
Start: 2022-01-14 — End: 2022-01-14
  Administered 2022-01-14: 150 ug via EPIDURAL

## 2022-01-14 MED ORDER — SIMETHICONE 80 MG PO CHEW
80.0000 mg | CHEWABLE_TABLET | Freq: Three times a day (TID) | ORAL | Status: DC
  Administered 2022-01-15 – 2022-01-17 (×6): 80 mg via ORAL
  Filled 2022-01-14 (×7): qty 1

## 2022-01-14 MED ORDER — SODIUM CHLORIDE 0.9 % IV SOLN: INTRAVENOUS | Status: DC | PRN

## 2022-01-14 MED ORDER — ACETAMINOPHEN 500 MG PO TABS: 1000.0000 mg | ORAL_TABLET | Freq: Once | ORAL | Status: DC

## 2022-01-14 MED ORDER — ONDANSETRON HCL 4 MG/2ML IJ SOLN
INTRAMUSCULAR | Status: DC | PRN
  Administered 2022-01-14: 4 mg via INTRAVENOUS

## 2022-01-14 MED ORDER — ACETAMINOPHEN 500 MG PO TABS
ORAL_TABLET | ORAL | Status: AC
  Filled 2022-01-14: qty 2

## 2022-01-14 MED ORDER — KETOROLAC TROMETHAMINE 30 MG/ML IJ SOLN: 30.0000 mg | Freq: Once | INTRAMUSCULAR | Status: DC

## 2022-01-14 MED ORDER — DIPHENHYDRAMINE HCL 25 MG PO CAPS: 25.0000 mg | ORAL_CAPSULE | ORAL | Status: DC | PRN

## 2022-01-14 MED ORDER — METHYLERGONOVINE MALEATE 0.2 MG/ML IJ SOLN: 0.2000 mg | INTRAMUSCULAR | Status: DC | PRN

## 2022-01-14 MED ORDER — FENTANYL CITRATE (PF) 100 MCG/2ML IJ SOLN
INTRAMUSCULAR | Status: DC | PRN
Start: 2022-01-14 — End: 2022-01-14

## 2022-01-14 MED ORDER — NALOXONE HCL 0.4 MG/ML IJ SOLN: 0.4000 mg | INTRAMUSCULAR | Status: DC | PRN

## 2022-01-14 MED ORDER — SODIUM CHLORIDE 0.9 % IR SOLN
Status: DC | PRN
  Administered 2022-01-14: 1000 mL

## 2022-01-14 MED ORDER — DIPHENHYDRAMINE HCL 25 MG PO CAPS: 25.0000 mg | ORAL_CAPSULE | Freq: Four times a day (QID) | ORAL | Status: DC | PRN

## 2022-01-14 MED ORDER — SODIUM CHLORIDE 0.9% FLUSH: 3.0000 mL | INTRAVENOUS | Status: DC | PRN

## 2022-01-14 MED ORDER — FENTANYL CITRATE (PF) 100 MCG/2ML IJ SOLN
INTRAMUSCULAR | Status: DC | PRN
Start: 2022-01-14 — End: 2022-01-14
  Administered 2022-01-14: 15 ug via EPIDURAL

## 2022-01-14 MED ORDER — DEXAMETHASONE SODIUM PHOSPHATE 4 MG/ML IJ SOLN
INTRAMUSCULAR | Status: DC | PRN
  Administered 2022-01-14 (×2): 4 mg via INTRAVENOUS

## 2022-01-14 MED ORDER — POVIDONE-IODINE 10 % EX SWAB
2.0000 "application " | Freq: Once | CUTANEOUS | Status: AC
  Administered 2022-01-14: 2 via TOPICAL

## 2022-01-14 MED ORDER — SOD CITRATE-CITRIC ACID 500-334 MG/5ML PO SOLN
ORAL | Status: AC
  Filled 2022-01-14: qty 30

## 2022-01-14 MED ORDER — SENNOSIDES-DOCUSATE SODIUM 8.6-50 MG PO TABS
2.0000 | ORAL_TABLET | Freq: Every day | ORAL | Status: DC
  Administered 2022-01-15 – 2022-01-16 (×2): 2 via ORAL
  Filled 2022-01-14 (×4): qty 2

## 2022-01-14 MED ORDER — SOD CITRATE-CITRIC ACID 500-334 MG/5ML PO SOLN
30.0000 mL | ORAL | Status: AC
  Administered 2022-01-14: 30 mL via ORAL

## 2022-01-14 MED ORDER — ONDANSETRON HCL 4 MG/2ML IJ SOLN
INTRAMUSCULAR | Status: AC
  Filled 2022-01-14: qty 2

## 2022-01-14 MED ORDER — DIPHENHYDRAMINE HCL 50 MG/ML IJ SOLN: 12.5000 mg | INTRAMUSCULAR | Status: DC | PRN

## 2022-01-14 MED ORDER — FENTANYL CITRATE (PF) 100 MCG/2ML IJ SOLN
INTRAMUSCULAR | Status: AC
  Filled 2022-01-14: qty 2

## 2022-01-14 MED ORDER — SCOPOLAMINE 1 MG/3DAYS TD PT72
1.0000 | MEDICATED_PATCH | TRANSDERMAL | Status: DC
  Administered 2022-01-14: 1.5 mg via TRANSDERMAL
  Filled 2022-01-14: qty 1

## 2022-01-14 MED ORDER — WITCH HAZEL-GLYCERIN EX PADS: 1.0000 "application " | MEDICATED_PAD | CUTANEOUS | Status: DC | PRN

## 2022-01-14 MED ORDER — LACTATED RINGERS IV SOLN: INTRAVENOUS | Status: DC

## 2022-01-14 MED ORDER — KETOROLAC TROMETHAMINE 30 MG/ML IJ SOLN: 30.0000 mg | Freq: Four times a day (QID) | INTRAMUSCULAR | Status: DC | PRN

## 2022-01-14 MED ORDER — CEFAZOLIN SODIUM-DEXTROSE 2-4 GM/100ML-% IV SOLN
2.0000 g | INTRAVENOUS | Status: AC
  Administered 2022-01-14: 2 g via INTRAVENOUS

## 2022-01-14 MED ORDER — OXYTOCIN-SODIUM CHLORIDE 30-0.9 UT/500ML-% IV SOLN
INTRAVENOUS | Status: DC | PRN
  Administered 2022-01-14: 30 [IU] via INTRAVENOUS

## 2022-01-14 MED ORDER — METHYLERGONOVINE MALEATE 0.2 MG PO TABS: 0.2000 mg | ORAL_TABLET | ORAL | Status: DC | PRN

## 2022-01-14 MED ORDER — BUPIVACAINE IN DEXTROSE 0.75-8.25 % IT SOLN
INTRATHECAL | Status: DC | PRN
  Administered 2022-01-14: 1.7 mL via INTRATHECAL

## 2022-01-14 MED ORDER — DROPERIDOL 2.5 MG/ML IJ SOLN: 0.6250 mg | Freq: Once | INTRAMUSCULAR | Status: DC | PRN

## 2022-01-14 MED ORDER — ONDANSETRON HCL 4 MG/2ML IJ SOLN
4.0000 mg | Freq: Three times a day (TID) | INTRAMUSCULAR | Status: DC | PRN
  Administered 2022-01-14: 4 mg via INTRAVENOUS
  Filled 2022-01-14: qty 2

## 2022-01-14 MED ORDER — OXYTOCIN-SODIUM CHLORIDE 30-0.9 UT/500ML-% IV SOLN: 2.5000 [IU]/h | INTRAVENOUS | Status: AC

## 2022-01-14 MED ORDER — DEXMEDETOMIDINE (PRECEDEX) IN NS 20 MCG/5ML (4 MCG/ML) IV SYRINGE
PREFILLED_SYRINGE | INTRAVENOUS | Status: AC
  Filled 2022-01-14: qty 5

## 2022-01-14 MED ORDER — CEFAZOLIN SODIUM-DEXTROSE 2-4 GM/100ML-% IV SOLN
INTRAVENOUS | Status: AC
  Filled 2022-01-14: qty 100

## 2022-01-14 MED ORDER — ACETAMINOPHEN 500 MG PO TABS
1000.0000 mg | ORAL_TABLET | Freq: Four times a day (QID) | ORAL | Status: AC
  Administered 2022-01-15: 1000 mg via ORAL
  Filled 2022-01-14 (×3): qty 2

## 2022-01-14 MED ORDER — FENTANYL CITRATE (PF) 100 MCG/2ML IJ SOLN
INTRAMUSCULAR | Status: DC | PRN
  Administered 2022-01-14: 25 ug via INTRAVENOUS

## 2022-01-14 MED ORDER — KETOROLAC TROMETHAMINE 30 MG/ML IJ SOLN
30.0000 mg | Freq: Four times a day (QID) | INTRAMUSCULAR | Status: DC | PRN
  Administered 2022-01-14: 30 mg via INTRAVENOUS

## 2022-01-14 MED ORDER — ACETAMINOPHEN 160 MG/5ML PO SOLN: 1000.0000 mg | Freq: Once | ORAL | Status: DC

## 2022-01-14 MED ORDER — DEXMEDETOMIDINE (PRECEDEX) IN NS 20 MCG/5ML (4 MCG/ML) IV SYRINGE
PREFILLED_SYRINGE | INTRAVENOUS | Status: DC | PRN
  Administered 2022-01-14: 4 ug via INTRAVENOUS

## 2022-01-14 MED ORDER — COCONUT OIL OIL
1.0000 "application " | TOPICAL_OIL | Status: DC | PRN
  Administered 2022-01-15: 1 via TOPICAL

## 2022-01-14 MED ORDER — PHENYLEPHRINE HCL-NACL 20-0.9 MG/250ML-% IV SOLN
INTRAVENOUS | Status: DC | PRN
  Administered 2022-01-14: 60 ug/min via INTRAVENOUS

## 2022-01-14 MED ORDER — PRENATAL MULTIVITAMIN CH
1.0000 | ORAL_TABLET | Freq: Every day | ORAL | Status: DC
  Administered 2022-01-15 – 2022-01-17 (×3): 1 via ORAL
  Filled 2022-01-14 (×3): qty 1

## 2022-01-14 MED ORDER — MORPHINE SULFATE (PF) 0.5 MG/ML IJ SOLN
INTRAMUSCULAR | Status: AC
  Filled 2022-01-14: qty 10

## 2022-01-14 MED ORDER — ZOLPIDEM TARTRATE 5 MG PO TABS: 5.0000 mg | ORAL_TABLET | Freq: Every evening | ORAL | Status: DC | PRN

## 2022-01-14 MED ORDER — PHENYLEPHRINE HCL-NACL 20-0.9 MG/250ML-% IV SOLN
INTRAVENOUS | Status: AC
  Filled 2022-01-14: qty 250

## 2022-01-14 MED ORDER — KETOROLAC TROMETHAMINE 30 MG/ML IJ SOLN
INTRAMUSCULAR | Status: AC
  Filled 2022-01-14: qty 1

## 2022-01-14 MED ORDER — NALOXONE HCL 4 MG/10ML IJ SOLN
1.0000 ug/kg/h | INTRAVENOUS | Status: DC | PRN
  Filled 2022-01-14: qty 5

## 2022-01-14 SURGICAL SUPPLY — 41 items
BARRIER ADHS 3X4 INTERCEED (GAUZE/BANDAGES/DRESSINGS) IMPLANT
BENZOIN TINCTURE PRP APPL 2/3 (GAUZE/BANDAGES/DRESSINGS) ×3 IMPLANT
BINDER ABDOMINAL 10 UNV 27-48 (MISCELLANEOUS) IMPLANT
BINDER ABDOMINAL 12 ML 46-62 (SOFTGOODS) IMPLANT
CHLORAPREP W/TINT 26ML (MISCELLANEOUS) ×6 IMPLANT
CLAMP CORD UMBIL (MISCELLANEOUS) ×3 IMPLANT
CLOSURE STERI STRIP 1/2 X4 (GAUZE/BANDAGES/DRESSINGS) ×2 IMPLANT
CLOTH BEACON ORANGE TIMEOUT ST (SAFETY) ×3 IMPLANT
DRSG OPSITE POSTOP 4X10 (GAUZE/BANDAGES/DRESSINGS) ×3 IMPLANT
DRSG PAD ABDOMINAL 8X10 ST (GAUZE/BANDAGES/DRESSINGS) ×2 IMPLANT
ELECT REM PT RETURN 9FT ADLT (ELECTROSURGICAL) ×3
ELECTRODE REM PT RTRN 9FT ADLT (ELECTROSURGICAL) ×2 IMPLANT
EXTRACTOR VACUUM KIWI (MISCELLANEOUS) IMPLANT
GAUZE SPONGE 4X4 12PLY STRL LF (GAUZE/BANDAGES/DRESSINGS) ×4 IMPLANT
GLOVE BIOGEL M 7.0 STRL (GLOVE) ×6 IMPLANT
GLOVE BIOGEL PI IND STRL 7.0 (GLOVE) ×6 IMPLANT
GLOVE BIOGEL PI INDICATOR 7.0 (GLOVE) ×3
GOWN STRL REUS W/TWL LRG LVL3 (GOWN DISPOSABLE) ×9 IMPLANT
HEMOSTAT ARISTA ABSORB 3G PWDR (HEMOSTASIS) ×2 IMPLANT
KIT ABG SYR 3ML LUER SLIP (SYRINGE) IMPLANT
LIGASURE IMPACT 36 18CM CVD LR (INSTRUMENTS) ×2 IMPLANT
NDL HYPO 25X5/8 SAFETYGLIDE (NEEDLE) IMPLANT
NEEDLE HYPO 25X5/8 SAFETYGLIDE (NEEDLE) IMPLANT
NS IRRIG 1000ML POUR BTL (IV SOLUTION) ×3 IMPLANT
PACK C SECTION WH (CUSTOM PROCEDURE TRAY) ×3 IMPLANT
PAD ABD 7.5X8 STRL (GAUZE/BANDAGES/DRESSINGS) ×2 IMPLANT
PAD OB MATERNITY 4.3X12.25 (PERSONAL CARE ITEMS) ×3 IMPLANT
RTRCTR C-SECT PINK 25CM LRG (MISCELLANEOUS) IMPLANT
STRIP CLOSURE SKIN 1/2X4 (GAUZE/BANDAGES/DRESSINGS) ×3 IMPLANT
SUT MNCRL 0 VIOLET CTX 36 (SUTURE) ×4 IMPLANT
SUT MONOCRYL 0 CTX 36 (SUTURE) ×2
SUT PDS AB 0 CT1 27 (SUTURE) ×6 IMPLANT
SUT PLAIN 0 NONE (SUTURE) IMPLANT
SUT VIC AB 2-0 CT1 27 (SUTURE) ×1
SUT VIC AB 2-0 CT1 TAPERPNT 27 (SUTURE) ×2 IMPLANT
SUT VIC AB 3-0 SH 27 (SUTURE)
SUT VIC AB 3-0 SH 27X BRD (SUTURE) IMPLANT
SUT VIC AB 4-0 KS 27 (SUTURE) ×3 IMPLANT
TOWEL OR 17X24 6PK STRL BLUE (TOWEL DISPOSABLE) ×3 IMPLANT
TRAY FOLEY W/BAG SLVR 14FR LF (SET/KITS/TRAYS/PACK) ×3 IMPLANT
WATER STERILE IRR 1000ML POUR (IV SOLUTION) ×3 IMPLANT

## 2022-01-14 NOTE — Anesthesia Postprocedure Evaluation (Signed)
Anesthesia Post Note ? ?Patient: Jacqueline Lynch ? ?Procedure(s) Performed: REPEAT CESAREAN SECTION with bilateral tubal ligation (Bilateral: Abdomen) ? ?  ? ?Patient location during evaluation: PACU ?Anesthesia Type: Spinal ?Level of consciousness: awake and alert ?Pain management: pain level controlled ?Vital Signs Assessment: post-procedure vital signs reviewed and stable ?Respiratory status: spontaneous breathing, nonlabored ventilation and respiratory function stable ?Cardiovascular status: blood pressure returned to baseline ?Postop Assessment: no apparent nausea or vomiting, spinal receding, no headache and no backache ?Anesthetic complications: no ? ? ?No notable events documented. ? ?Last Vitals:  ?Vitals:  ? 01/14/22 1045 01/14/22 1051  ?BP: (!) 86/61 103/63  ?Pulse: 82 78  ?Resp: (!) 23 16  ?Temp:    ?SpO2: 95% 98%  ?  ?Last Pain:  ?Vitals:  ? 01/14/22 1045  ?TempSrc:   ?PainSc: 3   ? ?Pain Goal:   ? ?LLE Motor Response: Purposeful movement (01/14/22 1045) ?LLE Sensation: Tingling (01/14/22 1045) ?RLE Motor Response: Purposeful movement (01/14/22 1045) ?RLE Sensation: Tingling (01/14/22 1045) ?L Sensory Level: T8 (01/14/22 1045) ?R Sensory Level: T10-Umbilical region (01/14/22 1045) ?Epidural/Spinal Function Cutaneous sensation: Tingles (01/14/22 1030), Patient able to flex knees: No (01/14/22 1030), Patient able to lift hips off bed: No (01/14/22 1030), Back pain beyond tenderness at insertion site: No (01/14/22 1030), Progressively worsening motor and/or sensory loss: No (01/14/22 1030) ? ?Shanda Howells ? ? ? ? ?

## 2022-01-14 NOTE — Lactation Note (Addendum)
This note was copied from a baby's chart. ?Lactation Consultation Noted ? ?RN called for latch assistance. [facility] w/another patient at that time. ?LC went after that consult completed. Mom was unable to latch baby d/t baby sleepy. Mom hand expressed colostrum and spoon fed baby. Mom stated baby took it well then went back to sleep. ?Newborn behavior and feeding habits reviewed. ?Praised mom for hand expressing and spoon feeding. Mom had been doing STS for about 4 hrs. ?Encouraged mom to rest while baby rest. ?If baby hasn't cued in 3 hrs then try to feed baby again. ?Encouraged to call for assistance when needed. ?LC swaddled baby and placed in bassinet. ? ?Patient Name: Jacqueline Lynch   ? ? ?Today's Date: 01/14/2022 ?Reason for consult: Mother's request ?Age:64 hours ? ?Maternal Data ?  ? ?Feeding ?  ? ?LATCH Score ?Latch: Too sleepy or reluctant, no latch achieved, no sucking elicited. ? ?  ? ?  ? ?  ? ?  ? ?  ? ? ?Lactation Tools Discussed/Used ?  ? ?Interventions ?  ? ?Discharge ?  ? ?Consult Status ?Consult Status: Follow-up ?Date: 01/15/22 ?Follow-up type: In-patient ? ? ? ?Charyl Dancer ?01/14/2022, 9:05 PM ? ? ? ?

## 2022-01-14 NOTE — Lactation Note (Signed)
This note was copied from a baby's chart. ?Lactation Consultation Note ? ?Patient Name: Jacqueline Lynch ?Today's Date: 01/14/2022 ?Reason for consult: L&D Initial assessment;Term ?Age:39 hours ? ?Lactation conducted an initial consult with [name]. I observed her breast feeding baby on the right breast. He appeared sleepy but would suckle with gentle "pestering." Mom has experience breast feeding; she breast fed both of her previous children. Reports that she has used a nipple shield in the past and requests one for this baby. I provided; baby appeared to be latched well without one on the right side, but may benefit from one on the left breast. ? ?I educated on breast feeding basics including addressing [name]'s question about how to know if baby is getting enough to eat. I recommended that she breast feed on demand 8-12 times a day and wake baby to feed as needed. I encouraged her to hand express and spoon or finger feed. She was already doing STS; I encouraged her to continue to offer STS to encourage bonding and responsiveness to infant feeding cues. ? ?Maternal Data ?Has patient been taught Hand Expression?: Yes ?Does the patient have breastfeeding experience prior to this delivery?: Yes ? ?Feeding ?Mother's Current Feeding Choice: Breast Milk ? ?LATCH Score ?Latch: Grasps breast easily, tongue down, lips flanged, rhythmical sucking. (r breast only) ? ?Audible Swallowing: A few with stimulation ? ?Type of Nipple: Everted at rest and after stimulation ? ?Comfort (Breast/Nipple): Soft / non-tender ? ?Hold (Positioning): Assistance needed to correctly position infant at breast and maintain latch. ? ?LATCH Score: 8 ? ? ?Lactation Tools Discussed/Used ?Tools: Pump;Nipple Dorris Carnes ?Nipple shield size: 20 ?Breast pump type: Manual ?Pump Education: Setup, frequency, and cleaning ?Reason for Pumping: requested for left side ?Pumping frequency: PRN ? ?Interventions ?Interventions: Breast feeding basics  reviewed;Hand pump;Education ? ?Discharge ?Pump:  (Personal pump being shipped [employer])) ? ?Consult Status ?Consult Status: Follow-up ?Date: 01/15/22 ?Follow-up type: In-patient ? ? ? ?Walker Shadow ?01/14/2022, 5:28 PM ? ? ? ?

## 2022-01-14 NOTE — Transfer of Care (Signed)
Immediate Anesthesia Transfer of Care Note ? ?Patient: Jacqueline Lynch ? ?Procedure(s) Performed: REPEAT CESAREAN SECTION with bilateral tubal ligation (Bilateral: Abdomen) ? ?Patient Location: PACU ? ?Anesthesia Type:Spinal ? ?Level of Consciousness: awake, alert  and oriented ? ?Airway & Oxygen Therapy: Patient Spontanous Breathing ? ?Post-op Assessment: Report given to RN and Post -op Vital signs reviewed and stable ? ?Post vital signs: Reviewed and stable ? ?Last Vitals:  ?Vitals Value Taken Time  ?BP    ?Temp    ?Pulse    ?Resp    ?SpO2    ? ? ?Last Pain:  ?Vitals:  ? 01/14/22 0707  ?TempSrc: Oral  ?PainSc: 0-No pain  ?   ? ?  ? ?Complications: No notable events documented. ?

## 2022-01-14 NOTE — H&P (Signed)
Date of Initial H&P: 01/13/2022 ? ?History reviewed, patient examined, no change in status, stable for surgery.  ?

## 2022-01-14 NOTE — Anesthesia Procedure Notes (Signed)
Spinal ? ?Patient location during procedure: OR ?Start time: 01/14/2022 8:44 AM ?End time: 01/14/2022 8:48 AM ?Reason for block: surgical anesthesia ?Staffing ?Performed: anesthesiologist  ?Anesthesiologist: Kaylyn Layer, MD ?Preanesthetic Checklist ?Completed: patient identified, IV checked, risks and benefits discussed, monitors and equipment checked, pre-op evaluation and timeout performed ?Spinal Block ?Patient position: sitting ?Prep: DuraPrep and site prepped and draped ?Patient monitoring: heart rate, continuous pulse ox and blood pressure ?Approach: midline ?Location: L3-4 ?Injection technique: single-shot ?Needle ?Needle type: Pencan  ?Needle gauge: 24 G ?Needle length: 10 cm ?Assessment ?Sensory level: T4 ?Events: CSF return ?Additional Notes ?Risks, benefits, and alternative discussed. Patient gave consent to procedure. Prepped and draped in sitting position. Clear CSF obtained after one needle redirection. Positive terminal aspiration. No pain or paraesthesias with injection. Patient tolerated procedure well. Vital signs stable. Amalia Greenhouse, MD ? ? ? ? ?

## 2022-01-14 NOTE — Op Note (Signed)
Cesarean Section and bilateral salpingectomy Procedure Note ? ?Indications:  history of cesarean section x 2 and desire for permanent sterili zation  ? ?Pre-operative Diagnosis: 39 week 0 day pregnancy. ? ?Post-operative Diagnosis: same ? ?Surgeon: Gerald Leitz M.D ? ?Assistants: Dr. Steva Ready assisted due to complexity of the anatomy and concern for adhesive disease  ? ?Anesthesia: Spinal anesthesia ? ?ASA Class: 2 ? ? ?Procedure Details  ? ?The patient was seen in the Holding Room. The risks, benefits, complications, treatment options, and expected outcomes were discussed with the patient.  The patient concurred with the proposed plan, giving informed consent.  The site of surgery properly noted/marked. The patient was taken to Operating Room # B, identified as Jacqueline Lynch and the procedure verified as C-Section Delivery. A Time Out was held and the above information confirmed. ? ?After induction of anesthesia, the patient was draped and prepped in the usual sterile manner. A Pfannenstiel incision was made and carried down through the subcutaneous tissue to the fascia. Fascial incision was made and extended transversely. The fascia was separated from the underlying rectus tissue superiorly and inferiorly. The peritoneum was identified and entered. Peritoneal incision was extended longitudinally. The utero-vesical peritoneal reflection was incised transversely and the bladder flap was bluntly freed from the lower uterine segment. A low transverse uterine incision was made. Delivered from cephalic presentation was a 3270 gram Female with Apgar scores of 8 at one minute and 9 at five minutes. After the umbilical cord was clamped and cut cord blood was obtained for evaluation. The placenta was removed intact and appeared normal. The uterine outline, tubes and ovaries appeared normal. The uterine incision was closed with running locked sutures of  0 monocryl . Hemostasis was observed.  ? ?Attention was turned to the  left fallopian tube which was grasped with a babcock clamp. The mesosalpinx was cauterized and transected with ligasure. The left fallopian tube was excised 1 inch from the cornua of the uterus. This was repeated on the right fallopian tube.  Lavage was carried out until clear. The fascia was then reapproximated with running sutures of  0 pds . The skin was reapproximated with  4-0 vicryl  . ? ?Instrument, sponge, and needle counts were correct prior the abdominal closure and at the conclusion of the case.  ? ?Findings: ?Normal appearing fallopian tubes and ovaries. Fetus in cephalic presentation nuchal cord x 1  ? ?Estimated Blood Loss:  200 mL ?        ?Drains: None ?        ?Total IV Fluids:  per anesthesia ml ?        ?Specimens: Fallopian Tube, Right, Left, and Disposition:  Sent to Pathology ? ?        ?Implants: None ?        ?Complications:  None; patient tolerated the procedure well. ?        ?Disposition: PACU - hemodynamically stable. ?        ?Condition: stable ? ?Attending Attestation: I performed the procedure. ? ? ? ? ? ?

## 2022-01-15 LAB — SURGICAL PATHOLOGY

## 2022-01-15 LAB — CBC
HCT: 29.3 % — ABNORMAL LOW (ref 36.0–46.0)
Hemoglobin: 9.4 g/dL — ABNORMAL LOW (ref 12.0–15.0)
MCH: 27.4 pg (ref 26.0–34.0)
MCHC: 32.1 g/dL (ref 30.0–36.0)
MCV: 85.4 fL (ref 80.0–100.0)
Platelets: 211 10*3/uL (ref 150–400)
RBC: 3.43 MIL/uL — ABNORMAL LOW (ref 3.87–5.11)
RDW: 14.2 % (ref 11.5–15.5)
WBC: 8.2 10*3/uL (ref 4.0–10.5)
nRBC: 0 % (ref 0.0–0.2)

## 2022-01-15 NOTE — Progress Notes (Signed)
Postpartum Note Day #1 ? ?S:  Patient doing well.  Pain controlled.  Tolerating regular diet.   Ambulating and voiding without difficulty. Nausea improved with Scopolamine patch.  Denies fevers, chills, chest pain, SOB, N/V, or worsening bilateral LE edema. ? ?Lochia: Minimal ?Infant feeding:  Breast ?Circumcision:  Desires prior to discharge ?Contraception:  S/p bilateral salpingectomy ? ?O: Temp:  [97 ?F (36.1 ?C)-98.2 ?F (36.8 ?C)] 98.1 ?F (36.7 ?C) (04/20 0400) ?Pulse Rate:  [71-86] 77 (04/20 0400) ?Resp:  [14-23] 16 (04/20 0400) ?BP: (86-111)/(61-74) 96/74 (04/20 0400) ?SpO2:  [95 %-99 %] 98 % (04/19 1645) ?Gen: NAD, pleasant and cooperative ?Resp: No increased work of breathing ?Abdomen: soft, non-distended, non-tender throughout ?Uterus: firm, non-tender, below umbilicus ?Incision: c/d/i, bandage in place  ?Ext: No bilateral LE edema, no bilateral calf tenderness, SCDs on and working ? ?Labs:  ?Recent Labs  ?  01/12/22 ?1049 01/15/22 ?8341  ?HGB 10.0* 9.4*  ? ? ?A/P: Patient is a 41 y.o. [id number] POD#1 s/p LTCS+BS. ? ?- Pain well controlled  ?- GU: UOP is adequate ?- GI: Tolerating regular diet ?- Activity: encouraged sitting up to chair and ambulation as tolerated ?- DVT Prophylaxis: Frequent ambulation, SCDs ?- Labs: as above - oral iron ordered ? ?Disposition:  D/C home on POD#2-3. ?  ? ?Steva Ready, DO ?[phone] (office) ? ? ? ? ? ? ?

## 2022-01-16 NOTE — Lactation Note (Signed)
This note was copied from a baby's chart. ?Lactation Consultation Note ?Attempted to see mom. Mom had company and baby was sleeping. Asked mom to call for [facility] for latch tonight. Mom stated OK. ? ?Patient Name: Jacqueline Lynch ?Today's Date: 01/16/2022 ?  ?Age:39 hours ? ?Maternal Data ?  ? ?Feeding ?  ? ?LATCH Score ?  ? ?  ? ?  ? ?  ? ?  ? ?  ? ? ?Lactation Tools Discussed/Used ?  ? ?Interventions ?  ? ?Discharge ?  ? ?Consult Status ?  ? ? ? ?Charyl Dancer ?01/16/2022, 8:09 PM ? ? ? ?

## 2022-01-16 NOTE — Progress Notes (Signed)
Subjective: ?Postpartum Day 2: Cesarean Delivery ?Patient reports tolerating PO, + flatus, and no problems voiding.   ? ?Objective: ?Vital signs in last 24 hours: ?Temp:  [97.5 ?F (36.4 ?C)-98.7 ?F (37.1 ?C)] 97.5 ?F (36.4 ?C) (04/21 1339) ?Pulse Rate:  [72-80] 79 (04/21 1339) ?Resp:  [16-18] 16 (04/21 1339) ?BP: (100-109)/(69-72) 106/69 (04/21 1339) ?SpO2:  [97 %-100 %] 100 % (04/21 1339) ? ?Physical Exam:  ?General: alert, cooperative, and no distress ?Lochia: appropriate ?Uterine Fundus: firm ?Incision: healing well ?DVT Evaluation: No evidence of DVT seen on physical exam. ? ?Recent Labs  ?  01/15/22 ?1937  ?HGB 9.4*  ?HCT 29.3*  ? ? ?Assessment/Plan: ?Status post Cesarean section. Doing well postoperatively.  ?Continue current care ? Plan discharge home tomorrow . ? ?Gerald Leitz ?01/16/2022, 5:29 PM ? ? ?

## 2022-01-17 MED ORDER — HYDROCORTISONE 1 % EX CREA
TOPICAL_CREAM | Freq: Four times a day (QID) | CUTANEOUS | Status: DC
  Filled 2022-01-17: qty 28

## 2022-01-17 MED ORDER — OXYCODONE HCL 5 MG PO TABS: 5.0000 mg | ORAL_TABLET | ORAL | 0 refills | Status: DC | PRN

## 2022-01-17 MED ORDER — HYDROCORTISONE 1 % EX CREA: TOPICAL_CREAM | Freq: Four times a day (QID) | CUTANEOUS | 0 refills | Status: DC | PRN

## 2022-01-17 MED ORDER — IBUPROFEN 600 MG PO TABS: 600.0000 mg | ORAL_TABLET | Freq: Four times a day (QID) | ORAL | 1 refills | Status: AC | PRN

## 2022-01-17 MED ORDER — ACETAMINOPHEN 500 MG PO TABS: 1000.0000 mg | ORAL_TABLET | Freq: Three times a day (TID) | ORAL | 0 refills | Status: AC | PRN

## 2022-01-17 NOTE — Lactation Note (Addendum)
This note was copied from a baby's chart. ?Lactation Consultation Note ? ?Patient Name: Jacqueline Lynch ?Today's Date: 01/17/2022 ?Reason for consult: Follow-up assessment;Term;Nipple pain/trauma;Breastfeeding assistance ?Age:39 hours ? ?Mom pumping with DEBP. Mom states that she is having some discomfort when baby latches, but it subsides as he feels. Mom states that her breasts feed full today.  ? ?LC spoke with mom about milk storage guidelines and assessed mom's breast post pumping because mom stated that her breast still felt "hard". LC noted that her breasts were full.  ? ?Mom pumped for about 15 min and got about 85 mL.  ? ?LC spoke with mom about engorgement and breast care.  ? ?Comfort gels were given and mom was informed that she can use them for 24 hours and they should not be used with coconut oil.  ? ?Mom is aware of the support groups and outpatient support.  ? ?Mom plans to continue working on the latch and feeding expressed milk to baby after latching.  ? ?Maternal Data ?  ? ?Feeding ?Mother's Current Feeding Choice: Breast Milk ? ? ? ?Lactation Tools Discussed/Used ?Tools: Comfort gels ?Breast pump type: Double-Electric Breast Pump ?Pump Education: Milk Storage ?Reason for Pumping: Baby having trouble latching on the left side due to flat nipple per mom. ?Pumping frequency: Pumping after each feed since baby is not latching and 7% WL ?Pumped volume: 55 mL (64mL) ? ?Interventions ?  ? ?Discharge ?Pump: DEBP ? ?Consult Status ?Consult Status: Complete ?Follow-up type: Call as needed ? ? ? ?Jacqueline Lynch, IBCLC ?01/17/2022, 10:27 AM ? ? ? ?

## 2022-01-17 NOTE — Lactation Note (Signed)
This note was copied from a baby's chart. ?Lactation Consultation Note ?Baby prefers Rt. Breast d/t Lt. Nipple flat and mom has to use NS and mom states baby doesn't like the NS so mom is pumping collecting colostrum. Suggested to post feed w/colostrum. Mom OK. Milk storage reviewed. ?Praised mom. ? ?Patient Name: Jacqueline Lynch ?Today's Date: 01/17/2022 ?Reason for consult: Follow-up assessment;Term ?Age:39 hours ? ?Maternal Data ?  ? ?Feeding ?  ? ?LATCH Score ?Latch: Grasps breast easily, tongue down, lips flanged, rhythmical sucking. ? ?Audible Swallowing: Spontaneous and intermittent ? ?Type of Nipple: Everted at rest and after stimulation (Lt. nipple flat) ? ?Comfort (Breast/Nipple): Filling, red/small blisters or bruises, mild/mod discomfort ? ?Hold (Positioning): No assistance needed to correctly position infant at breast. ? ?LATCH Score: 9 ? ? ?Lactation Tools Discussed/Used ?  ? ?Interventions ?  ? ?Discharge ?  ? ?Consult Status ?Consult Status: Follow-up ?Date: 01/18/22 ?Follow-up type: In-patient ? ? ? ?Charyl Dancer ?01/17/2022, 4:12 AM ? ? ? ?

## 2022-01-17 NOTE — Discharge Summary (Signed)
? ?  Postpartum Discharge Summary ? ?Date of Service updated 01/17/2022 ? ?   ?Patient Name: Jacqueline Lynch ?DOB: [birth year] ?MRN: [id number] ? ?Date of admission: 01/14/2022 ?Delivery date:01/14/2022  ?Delivering provider: Christophe Louis  ?Date of discharge: 01/17/2022 ? ?Admitting diagnosis: S/P cesarean section [Z98.891] ?Intrauterine pregnancy: [redacted]w[redacted]d    ?Secondary diagnosis:  Principal Problem: ?  S/P cesarean section ? ?Additional problems: None    ?Discharge diagnosis: Term Pregnancy Delivered                                              ?Post partum procedures: None ?Augmentation: N/A ?Complications: None ? ?Hospital course: Sceduled C/S   41y.o. yo [id number] admitted to the hospital 01/14/2022 for scheduled cesarean section with the following indication:Elective Repeat.Delivery details are as follows:  ?Membrane Rupture Time/Date: 9:11 AM ,01/14/2022   ?Delivery Method:C-Section, Low Transverse  ?Details of operation can be found in separate operative note.  Patient had an uncomplicated postpartum course.  She is ambulating, tolerating a regular diet, passing flatus, and urinating well. Patient is discharged home in stable condition on  01/17/22 ?       ?Newborn Data: ?Birth date:01/14/2022  ?Birth time:9:12 AM  ?Gender:Female  ?Living status:Living  ?Apgars:8 ,9  ?Weight:3270 g    ? ?Magnesium Sulfate received: No ?BMZ received: No ?Rhophylac:N/A ?MMR:N/A ?T-DaP:Given prenatally ?Flu: N/A ?Transfusion:No ? ?Physical exam  ?Vitals:  ? 01/16/22 0645 01/16/22 1339 01/16/22 2027 01/17/22 0514  ?BP: 100/71 106/69 111/71 107/71  ?Pulse: 72 79 82 72  ?Resp: 17 16 20 18   ?Temp: 98.7 ?F (37.1 ?C) (!) 97.5 ?F (36.4 ?C) 97.8 ?F (36.6 ?C) 98.2 ?F (36.8 ?C)  ?TempSrc: Oral Oral Oral Oral  ?SpO2: 97% 100% 99% 99%  ?Weight:      ?Height:      ? ?General: alert, cooperative, and no distress ?Lochia: appropriate ?Uterine Fundus: firm ?Incision: Healing well. Pt has an erythematous rash on abdomen where the pressure dressing  was removed.  ?DVT Evaluation: No evidence of DVT seen on physical exam. ?Labs: ?Lab Results  ?Component Value Date  ? WBC 8.2 01/15/2022  ? HGB 9.4 (L) 01/15/2022  ? HCT 29.3 (L) 01/15/2022  ? MCV 85.4 01/15/2022  ? PLT 211 01/15/2022  ? ?   ? View : No data to display.  ?  ?  ?  ? ?Edinburgh Score: ? ?  01/14/2022  ?  6:40 PM  ?EdFlavia Shipperostnatal Depression Scale Screening Tool  ?I have been able to laugh and see the funny side of things. 0  ?I have looked forward with enjoyment to things. 0  ?I have blamed myself unnecessarily when things went wrong. 0  ?I have been anxious or worried for no good reason. 2  ?I have felt scared or panicky for no good reason. 0  ?Things have been getting on top of me. 0  ?I have been so unhappy that I have had difficulty sleeping. 0  ?I have felt sad or miserable. 0  ?I have been so unhappy that I have been crying. 0  ?The thought of harming myself has occurred to me. 0  ?Edinburgh Postnatal Depression Scale Total 2  ? ? ? ? ?After visit meds:  ?Allergies as of 01/17/2022   ?No Known Allergies ?  ? ?  ?Medication List  ?  ? ?TAKE  these medications   ? ?acetaminophen 500 MG tablet ?Commonly known as: TYLENOL ?Take 2 tablets (1,000 mg total) by mouth every 8 (eight) hours as needed. ?  ?hydrocortisone cream 1 % ?Apply topically every 6 (six) hours as needed for itching. ?  ?ibuprofen 600 MG tablet ?Commonly known as: ADVIL ?Take 1 tablet (600 mg total) by mouth every 6 (six) hours as needed. ?  ?oxyCODONE 5 MG immediate release tablet ?Commonly known as: Oxy IR/ROXICODONE ?Take 1-2 tablets (5-10 mg total) by mouth every 4 (four) hours as needed for moderate pain. ?  ?prenatal multivitamin Tabs tablet ?Take 1 tablet by mouth at bedtime. ?  ? ?  ? ? ? ?Discharge home in stable condition ?Infant Feeding: Breast ?Infant Disposition:home with mother ?Discharge instruction: per After Visit Summary and Postpartum booklet. ?Activity: Advance as tolerated. Pelvic rest for 6 weeks.  ?Diet:  routine diet ?Anticipated Birth Control:  bilateral salpingectomy  ?Postpartum Appointment:2 weeks ?Additional Postpartum F/U: Incision check 2 weeks  ?Future Appointments:No future appointments. ?Follow up Visit: ? Follow-up Information   ? ? Christophe Louis, MD. Go in 2 week(s).   ?Specialty: Obstetrics and Gynecology ?Contact information: ?[address] ?[address] ?[place] [address] ?[phone] ? ? ?  ?  ? ?  ?  ? ?  ? ? ? ?  ? ?01/17/2022 ?Christophe Louis, MD ? ? ?

## 2022-01-24 ENCOUNTER — Telehealth (HOSPITAL_COMMUNITY): Payer: Self-pay

## 2022-01-24 NOTE — Telephone Encounter (Signed)
No answer. Left message to return nurse call. ? Heron Nay ?01/24/2022,0930 ?

## 2023-03-10 NOTE — Therapy (Signed)
OUTPATIENT PHYSICAL THERAPY EVALUATION PHYSICAL THERAPY DISCHARGE SUMMARY  Visits from Start of Care: 1  Current functional level related to goals / functional outcomes: See goals   Remaining deficits: See impression   Education / Equipment: See education    Patient agrees to discharge. Patient goals were met. Patient is being discharged due to  meeting the stated rehab goals and leaving the country for 2.5 months.   Patient Name: Jacqueline Lynch MRN: [id number] DOB:1980-12-29,42 y.o., female Today's Date: 03/11/2023   END OF SESSION:  PT End of Session - 03/11/23 1138     Visit Number 1    Date for PT Re-Evaluation --   N/A   Authorization Type [employer] MCD    PT Start Time 1104    PT Stop Time 1136    PT Time Calculation (min) 32 min    Activity Tolerance Patient tolerated treatment well    Behavior During Therapy [facility] for tasks assessed/performed              Past Medical History:  Diagnosis Date   Medical history non-contributory    Past Surgical History:  Procedure Laterality Date   CESAREAN SECTION N/A 12/28/2013   Procedure: Primary Cesarean Section Delivery Baby Boy @ 2109, Apgars 8/9;  Surgeon: Dorien Chihuahua. Richardson Dopp, MD;  Location: [facility];  Service: Obstetrics;  Laterality: N/A;   CESAREAN SECTION N/A 01/31/2020   Procedure: CESAREAN SECTION;  Surgeon: Gerald Leitz, MD;  Location: [facility] OR[facility];  Service: Obstetrics;  Laterality: N/A;   CESAREAN SECTION WITH BILATERAL TUBAL LIGATION Bilateral 01/14/2022   Procedure: REPEAT CESAREAN SECTION with bilateral tubal ligation;  Surgeon: Gerald Leitz, MD;  Location: [facility] ORS;  Service: Obstetrics;  Laterality: Bilateral;   NO PAST SURGERIES     Patient Active Problem List   Diagnosis Date Noted   PROM (premature rupture of membranes) 01/31/2020   S/P cesarean section 01/31/2020   Status post primary low transverse cesarean section 12/30/2013   Arrest of descent, delivered, current hospitalization 12/28/2013   Delivered by cesarean  section 12/28/2013    PCP: none   REFERRING PROVIDER: Salli Real, MD   REFERRING DIAG:  Diagnosis  M54.9 (ICD-10-CM) - Dorsalgia, unspecified    Rationale for Evaluation and Treatment: Rehabilitation  THERAPY DIAG:  Cervicalgia  ONSET DATE: 02/23/23  SUBJECTIVE:                                                                                                                                                                                           SUBJECTIVE STATEMENT: Patient reports ever since she went to the doctor she has not had any  issues. She did not have to use the medication she was prescribed. She was having pain in the Lt upper trap/neck for a few days where she couldn't move her neck very well that could have been caused by breast feeding. She woke up one morning in May and had this pain and went to see her doctor, but this pain subsided within 2 days. She does report sometimes having some numbness along the middle of the back that has been ongoing for a few months noticing it more if she bends over. She does have occasional soreness in the Lt upper trap when she completes household activity. She is getting ready to be out of the country for 2.5 months.   PERTINENT HISTORY:  C-section x 3   PAIN:  Are you having pain?none currently Yes: NPRS scale: 3/10 Pain location: Lt upper trap Pain description: soreness Aggravating factors: overactivity Relieving factors: unknown  PRECAUTIONS: None  WEIGHT BEARING RESTRICTIONS: No  FALLS:  Has patient fallen in last 6 months? No  LIVING ENVIRONMENT: Lives with: lives with their family Lives in: House/apartment Stairs: Yes: Internal: 14 steps; on right going up Has following equipment at home: None  OCCUPATION: work from home  PLOF: Independent  PATIENT GOALS: "to make sure everything is fine."    OBJECTIVE:   DIAGNOSTIC FINDINGS:  None file   PATIENT SURVEYS:  N/A   COGNITION: Overall cognitive status: Within  functional limits for tasks assessed     SENSATION: WFL   POSTURE: rounded shoulders and forward head  PALPATION: [place] Lt upper trap Cervical PAIVM WNL, pan free  CERVICAL ROM:   Active ROM A/PROM (deg) eval  Flexion WNL  Extension WNL  Right lateral flexion WNL sore Lt upper trap  Left lateral flexion WNL  Right rotation WNL  Left rotation WNL sore Lt upper trap    (Blank rows = not tested) UPPER EXTREMITY MMT:  MMT Right eval Left eval  Shoulder flexion 4 shoulder shrug 4 shoulder shrug   Shoulder extension    Shoulder abduction    Shoulder adduction    Shoulder extension    Shoulder internal rotation    Shoulder external rotation    Middle trapezius 4 4  Lower trapezius    Elbow flexion    Elbow extension    Wrist flexion    Wrist extension    Wrist ulnar deviation    Wrist radial deviation    Wrist pronation    Wrist supination    Grip strength     (Blank rows = not tested)  SPECIAL TESTS:  (-) Cervical Compression/Distraction   FUNCTIONAL TESTS:  N/A    [facility] Adult PT Treatment:                                                DATE: 03/11/23  Therapeutic Exercise: Demonstrated and issued initial HEP.   Self-care: posture education, demo and returned demo of use of tennis ball for self-soft tissue mobilization.      PATIENT EDUCATION:  Education details: see treatment; assessment findings, D/C education Person educated: Patient Education method: Explanation, Demonstration, Cues, handout Education comprehension: verbalized understanding, returned demonstration  HOME EXERCISE PROGRAM: Access Code: [email or web address] URL: [email or web address] Date: 03/11/2023 Prepared by: Letitia Libra  Exercises - Seated Upper Trapezius Stretch  - 1 x daily - 7 x weekly - 3 sets -  20 sec  hold - Seated Levator Scapulae Stretch  - 1 x daily - 7 x weekly - 3 sets - 20 sec  hold - Doorway Pec Stretch at 60 Elevation  - 1 x daily - 7 x weekly - 3 sets  - 20 sec  hold - Shoulder External Rotation and Scapular Retraction with Resistance  - 1 x daily - 7 x weekly - 2 sets - 10 reps - Standing Shoulder Horizontal Abduction with Resistance  - 1 x daily - 7 x weekly - 2 sets - 10 reps - Seated Cervical Retraction  - 1 x daily - 7 x weekly - 2 sets - 10 reps  ASSESSMENT:  CLINICAL IMPRESSION: Patient is a 42 y.o. female who was seen today for physical therapy evaluation and treatment for dorsalgia. Patient reports onset of Lt upper trap pain in May that she attributes to breast feeding. She reports the pain resolved within 2 days and has not experienced this pain over the past few weeks. She does endorse occasional soreness in the Lt upper trap. Her signs and symptoms appear muscular in nature with a postural component. We discussed proper posture and provided her postural corrective exercises to begin to implement independently as she is getting ready to go out of the country for the next 2.5 months and will not be able to participate in PT at our clinic. She is therefore appropriate for discharge with patient in agreement with this plan.   OBJECTIVE IMPAIRMENTS: decreased knowledge of condition, decreased strength, increased fascial restrictions, postural dysfunction, and pain.   ACTIVITY LIMITATIONS: carrying, lifting, bending, and caring for others  PARTICIPATION LIMITATIONS: meal prep, cleaning, and laundry  PERSONAL FACTORS: Age, Fitness, and Profession are also affecting patient's functional outcome.   REHAB POTENTIAL: Good  CLINICAL DECISION MAKING: Stable/uncomplicated  EVALUATION COMPLEXITY: Low   GOALS: Goals reviewed with patient? Yes  SHORT TERM GOALS: = LTG  Patient will be independent with initial HEP  Baseline: issued today Goal status: MET   PLAN:  PT FREQUENCY: N/A  PT DURATION: N/A  PLANNED INTERVENTIONS: Therapeutic exercises, Therapeutic activity, Self Care, Manual therapy, and Re-evaluation.  PLAN FOR NEXT  SESSION: n/a  Letitia Libra, PT, DPT, ATC 03/11/23 11:47 AM

## 2023-03-11 ENCOUNTER — Other Ambulatory Visit: Payer: Self-pay

## 2023-03-11 ENCOUNTER — Ambulatory Visit: Payer: Medicaid Other | Attending: Internal Medicine

## 2023-03-11 DIAGNOSIS — M542 Cervicalgia: Secondary | ICD-10-CM | POA: Diagnosis present

## 2024-06-06 ENCOUNTER — Other Ambulatory Visit (HOSPITAL_COMMUNITY)
Admission: RE | Admit: 2024-06-06 | Discharge: 2024-06-06 | Disposition: A | Discharge status: Home / Self Care | Source: Ambulatory Visit | Attending: Obstetrics and Gynecology | Admitting: Obstetrics and Gynecology

## 2024-06-06 DIAGNOSIS — Z01419 Encounter for gynecological examination (general) (routine) without abnormal findings: Secondary | ICD-10-CM | POA: Diagnosis present

## 2024-06-08 LAB — CYTOLOGY - PAP
Comment: NEGATIVE
Diagnosis: NEGATIVE
High risk HPV: NEGATIVE

## 2024-06-30 ENCOUNTER — Other Ambulatory Visit: Payer: Self-pay | Admitting: Family Medicine

## 2024-06-30 DIAGNOSIS — Z1231 Encounter for screening mammogram for malignant neoplasm of breast: Secondary | ICD-10-CM

## 2024-07-13 ENCOUNTER — Ambulatory Visit
Admission: RE | Admit: 2024-07-13 | Discharge: 2024-07-13 | Disposition: A | Discharge status: Home / Self Care | Source: Ambulatory Visit | Attending: Family Medicine | Admitting: Family Medicine

## 2024-07-13 DIAGNOSIS — Z1231 Encounter for screening mammogram for malignant neoplasm of breast: Secondary | ICD-10-CM

## 2024-08-03 ENCOUNTER — Inpatient Hospital Stay

## 2024-08-03 ENCOUNTER — Inpatient Hospital Stay: Attending: Oncology | Admitting: Oncology

## 2024-08-03 ENCOUNTER — Encounter: Payer: Self-pay | Admitting: Oncology

## 2024-08-03 VITALS — BP 110/62 | HR 80 | Temp 97.4°F | Resp 16 | Ht 64.0 in | Wt 145.0 lb

## 2024-08-03 DIAGNOSIS — D500 Iron deficiency anemia secondary to blood loss (chronic): Secondary | ICD-10-CM

## 2024-08-03 DIAGNOSIS — N920 Excessive and frequent menstruation with regular cycle: Secondary | ICD-10-CM | POA: Insufficient documentation

## 2024-08-03 LAB — IRON AND IRON BINDING CAPACITY (CC-WL,HP ONLY)
Iron: 48 ug/dL (ref 28–170)
Saturation Ratios: 13 % (ref 10.4–31.8)
TIBC: 363 ug/dL (ref 250–450)
UIBC: 315 ug/dL (ref 148–442)

## 2024-08-03 LAB — CBC WITH DIFFERENTIAL (CANCER CENTER ONLY)
Abs Immature Granulocytes: 0.02 K/uL (ref 0.00–0.07)
Basophils Absolute: 0 K/uL (ref 0.0–0.1)
Basophils Relative: 0 %
Eosinophils Absolute: 0.1 K/uL (ref 0.0–0.5)
Eosinophils Relative: 2 %
HCT: 35 % — ABNORMAL LOW (ref 36.0–46.0)
Hemoglobin: 10.9 g/dL — ABNORMAL LOW (ref 12.0–15.0)
Immature Granulocytes: 0 %
Lymphocytes Relative: 27 %
Lymphs Abs: 1.7 K/uL (ref 0.7–4.0)
MCH: 25.3 pg — ABNORMAL LOW (ref 26.0–34.0)
MCHC: 31.1 g/dL (ref 30.0–36.0)
MCV: 81.2 fL (ref 80.0–100.0)
Monocytes Absolute: 0.5 K/uL (ref 0.1–1.0)
Monocytes Relative: 8 %
Neutro Abs: 3.8 K/uL (ref 1.7–7.7)
Neutrophils Relative %: 63 %
Platelet Count: 260 K/uL (ref 150–400)
RBC: 4.31 MIL/uL (ref 3.87–5.11)
RDW: 21.2 % — ABNORMAL HIGH (ref 11.5–15.5)
WBC Count: 6.1 K/uL (ref 4.0–10.5)
nRBC: 0 % (ref 0.0–0.2)

## 2024-08-03 LAB — FERRITIN: Ferritin: 28 ng/mL (ref 11–307)

## 2024-08-03 NOTE — Progress Notes (Signed)
 [facility]  HEMATOLOGY CLINIC CONSULTATION NOTE   PATIENT NAME: Jacqueline Lynch   MR#: [id number] DOB: 06/08/1981  DATE OF SERVICE: 08/03/2024  Patient Care Team: Auston Opal, DO as PCP - General (Family Medicine)  REASON FOR CONSULTATION/ CHIEF COMPLAINT:  Evaluation of anemia.  ASSESSMENT & PLAN:   Jacqueline Lynch is a 43 y.o. lady with a past medical history of headaches, was referred to our service for evaluation of microcytic anemia.  Presumed to be from iron deficiency related to menstrual blood loss.  Iron deficiency anemia due to chronic blood loss Iron deficiency anemia likely secondary to chronic menstrual blood loss. Hemoglobin improved from 8.8 in September to 9 in October.   Iron studies in October 2025 showed slightly elevated iron levels, likely due to supplementation.   Differential diagnosis includes hereditary hemochromatosis and alpha thalassemia, but suspicion is low due to current iron supplementation and lack of family history.   No signs of severe anemia such as chest pain or dyspnea, but reports fatigue and sleepiness. No other sources of blood loss identified.   Oral iron supplementation is well-tolerated without side effects.  Labs today showed much improved hemoglobin of 10.9, MCV normal at 81.2.  White count and platelet count are normal.  Iron studies currently show no evidence of iron deficiency.  I called the patient with results of above workup.  -She was advised to continue oral iron supplementation once daily.  No indication for IV iron currently.  No indication for additional workup.   Educated on taking iron with vitamin C to enhance absorption.  - Scheduled follow-up appointment in four months.   I reviewed lab results and outside records for this visit and discussed relevant results with the patient. Diagnosis, plan of care and treatment options were also discussed in detail with the patient. Opportunity provided to ask  questions and answers provided to her apparent satisfaction. Provided instructions to call our clinic with any problems, questions or concerns prior to return visit. I recommended to continue follow-up with PCP and sub-specialists. She verbalized understanding and agreed with the plan. No barriers to learning was detected.  Jacqueline Embree, MD [facility] - A DEPT OF [facility] [address] [place] [address] Dept: [phone] Dept Fax: [phone]   HISTORY OF PRESENT ILLNESS:  Discussed the use of AI scribe software for clinical note transcription with the patient, who gave verbal consent to proceed.  History of Present Illness Jacqueline Lynch is a 43 year old female who presents with anemia. She was referred by Dr. Auston for evaluation of anemia.  On 06/06/2024, labs at her PCPs office showed hemoglobin of 8.8, hematocrit 27.9, MCV 69.1.  White count was 6000.  Platelet count was 287,000.  She was started on oral iron supplements.  Repeat labs on 06/30/2024 showed persistent anemia with hemoglobin of 9.6, hematocrit 29.6, MCV 71.5.  White count and platelet count were normal.  CMP unremarkable.  Iron studies at this time showed iron saturation of 82%, iron increased at 324.  She was referred to us  for further evaluation of microcytic anemia.  She was diagnosed with low iron levels while in [place] last year and was prescribed medication, though not specifically iron supplements. She began taking over-the-counter iron supplements about a month ago, along with multivitamins. She takes the iron pill once daily and has not experienced any side effects such as constipation, heartburn, or nausea. Her  hemoglobin was 8.8 in September and 9 in October.  She has experienced heavy menstrual bleeding since having her tubes removed two and a half years ago, which she believes contributes to her anemia. Her periods are particularly heavy  on the second day, with blood clots present. No other sources of blood loss such as epistaxis or gum bleeding are noted.  She experiences fatigue and sleepiness, noting that she can fall asleep easily whenever she lies down. No chest pain, trouble breathing, or any unusual cravings such as for ice chips, though she does have a preference for sweets.     MEDICAL HISTORY:  Past Medical History:  Diagnosis Date   Medical history non-contributory     SURGICAL HISTORY: Past Surgical History:  Procedure Laterality Date   CESAREAN SECTION N/A 12/28/2013   Procedure: Primary Cesarean Section Delivery Baby Boy @ 2109, Apgars 8/9;  Surgeon: Rexene PARAS. Rosalva, MD;  Location: [facility];  Service: Obstetrics;  Laterality: N/A;   CESAREAN SECTION N/A 01/31/2020   Procedure: CESAREAN SECTION;  Surgeon: Rosalva Rexene, MD;  Location: [facility];  Service: Obstetrics;  Laterality: N/A;   CESAREAN SECTION WITH BILATERAL TUBAL LIGATION Bilateral 01/14/2022   Procedure: REPEAT CESAREAN SECTION with bilateral tubal ligation;  Surgeon: Rosalva Rexene, MD;  Location: [facility];  Service: Obstetrics;  Laterality: Bilateral;   NO PAST SURGERIES      SOCIAL HISTORY: She reports that she has never smoked. She has never used smokeless tobacco. She reports that she does not drink alcohol and does not use drugs. Social History   Socioeconomic History   Marital status: Married    Spouse name: Not on file   Number of children: Not on file   Years of education: Not on file   Highest education level: Not on file  Occupational History   Not on file  Tobacco Use   Smoking status: Never   Smokeless tobacco: Never  Vaping Use   Vaping status: Never Used  Substance and Sexual Activity   Alcohol use: No   Drug use: No   Sexual activity: Yes    Birth control/protection: None  Other Topics Concern   Not on file  Social History Narrative   Not on file   Social Drivers of Health   Financial Resource Strain: Not on File  (03/30/2022)   Received from [employer]    Financial Resource Strain: 0  Food Insecurity: No Food Insecurity (08/03/2024)   Hunger Vital Sign    Worried About Running Out of Food in the Last Year: Never true    Ran Out of Food in the Last Year: Never true  Transportation Needs: No Transportation Needs (08/03/2024)   PRAPARE - Administrator, Civil Service (Medical): No    Lack of Transportation (Non-Medical): No  Physical Activity: Not on File (03/30/2022)   [facility]   Physical Activity    Physical Activity: 0  Stress: Not on File (03/30/2022)   Received from [facility]   Stress    Stress: 0  Social Connections: Not on File (06/12/2023)   Received from [facility]   Social Connections    Connectedness: 0  Intimate Partner Violence: Not At Risk (08/03/2024)   Humiliation, Afraid, Rape, and Kick questionnaire    Fear of Current or Ex-Partner: No    Emotionally Abused: No    Physically Abused: No    Sexually Abused: No    FAMILY HISTORY: Family History  Problem  Relation Age of Onset   Alcohol abuse Neg Hx    Arthritis Neg Hx    Asthma Neg Hx    Birth defects Neg Hx    Cancer Neg Hx    COPD Neg Hx    Depression Neg Hx    Diabetes Neg Hx    Drug abuse Neg Hx    Early death Neg Hx    Hearing loss Neg Hx    Heart disease Neg Hx    Hyperlipidemia Neg Hx    Hypertension Neg Hx    Kidney disease Neg Hx    Learning disabilities Neg Hx    Mental illness Neg Hx    Mental retardation Neg Hx    Miscarriages / Stillbirths Neg Hx    Stroke Neg Hx    Vision loss Neg Hx    Breast cancer Neg Hx     ALLERGIES:  She is allergic to tape and cephalexin.  MEDICATIONS:  Current Outpatient Medications  Medication Sig Dispense Refill   acetaminophen  (TYLENOL ) 500 MG tablet Take 2 tablets (1,000 mg total) by mouth every 8 (eight) hours as needed. 30 tablet 0   ferrous sulfate  325 (65 FE) MG EC tablet Take 325 mg by mouth daily.     ibuprofen  (ADVIL ) 600 MG  tablet Take 1 tablet (600 mg total) by mouth every 6 (six) hours as needed. 30 tablet 1   Multiple Vitamin (MULTIVITAMIN) tablet Take 1 tablet by mouth daily.     No current facility-administered medications for this visit.    REVIEW OF SYSTEMS:    Review of Systems - Oncology  All other pertinent systems were reviewed and were negative except as mentioned above.  PHYSICAL EXAMINATION:   Onc Performance Status - 08/03/24 1018       ECOG Perf Status   ECOG Perf Status Restricted in physically strenuous activity but ambulatory and able to carry out work of a light or sedentary nature, e.g., light house work, office work      KPS SCALE   KPS % SCORE Able to carry on normal activity, minor s/s of disease          Vitals:   08/03/24 1004  BP: 110/62  Pulse: 80  Resp: 16  Temp: (!) 97.4 F (36.3 C)  SpO2: 100%   Filed Weights   08/03/24 1004  Weight: 145 lb (65.8 kg)    Physical Exam Constitutional:      General: She is not in acute distress.    Appearance: Normal appearance.  HENT:     Head: Normocephalic and atraumatic.  Cardiovascular:     Rate and Rhythm: Normal rate.  Pulmonary:     Effort: Pulmonary effort is normal. No respiratory distress.  Abdominal:     General: There is no distension.  Neurological:     General: No focal deficit present.     Mental Status: She is alert and oriented to person, place, and time.  Psychiatric:        Mood and Affect: Mood normal.        Behavior: Behavior normal.      LABORATORY DATA:   I have reviewed the data as listed.  Results for orders placed or performed in visit on 08/03/24  Ferritin  Result Value Ref Range   Ferritin 28 11 - 307 ng/mL  Iron and Iron Binding Capacity ([facility] only)  Result Value Ref Range   Iron 48 28 - 170 ug/dL   TIBC 636 749 -  450 ug/dL   Saturation Ratios 13 10.4 - 31.8 %   UIBC 315 148 - 442 ug/dL  CBC with Differential (Cancer Center Only)  Result Value Ref Range   WBC  Count 6.1 4.0 - 10.5 K/uL   RBC 4.31 3.87 - 5.11 MIL/uL   Hemoglobin 10.9 (L) 12.0 - 15.0 g/dL   HCT 64.9 (L) 63.9 - 53.9 %   MCV 81.2 80.0 - 100.0 fL   MCH 25.3 (L) 26.0 - 34.0 pg   MCHC 31.1 30.0 - 36.0 g/dL   RDW 78.7 (H) 88.4 - 84.4 %   Platelet Count 260 150 - 400 K/uL   nRBC 0.0 0.0 - 0.2 %   Neutrophils Relative % 63 %   Neutro Abs 3.8 1.7 - 7.7 K/uL   Lymphocytes Relative 27 %   Lymphs Abs 1.7 0.7 - 4.0 K/uL   Monocytes Relative 8 %   Monocytes Absolute 0.5 0.1 - 1.0 K/uL   Eosinophils Relative 2 %   Eosinophils Absolute 0.1 0.0 - 0.5 K/uL   Basophils Relative 0 %   Basophils Absolute 0.0 0.0 - 0.1 K/uL   Immature Granulocytes 0 %   Abs Immature Granulocytes 0.02 0.00 - 0.07 K/uL     RADIOGRAPHIC STUDIES:  I have personally reviewed the radiological images as listed and agree with the findings in the report.  MM 3D SCREENING MAMMOGRAM BILATERAL BREAST Result Date: 07/18/2024 CLINICAL DATA:  Screening. EXAM: DIGITAL SCREENING BILATERAL MAMMOGRAM WITH TOMOSYNTHESIS AND CAD TECHNIQUE: Bilateral screening digital craniocaudal and mediolateral oblique mammograms were obtained. Bilateral screening digital breast tomosynthesis was performed. The images were evaluated with computer-aided detection. COMPARISON:  None available. ACR Breast Density Category c: The breasts are heterogeneously dense, which may obscure small masses. FINDINGS: There are no findings suspicious for malignancy. IMPRESSION: No mammographic evidence of malignancy. A result letter of this screening mammogram will be mailed directly to the patient. RECOMMENDATION: Screening mammogram in one year. (Code:SM-B-01Y) BI-RADS CATEGORY  1: Negative. Electronically Signed   By: Alm Parkins M.D.   On: 07/18/2024 11:47    Orders Placed This Encounter  Procedures   CBC with Differential (Cancer Center Only)    Standing Status:   Future    Number of Occurrences:   1    Expiration Date:   08/03/2025   Iron and Iron  Binding Capacity ([facility] only)    Standing Status:   Future    Number of Occurrences:   1    Expiration Date:   08/03/2025   Ferritin    Standing Status:   Future    Number of Occurrences:   1    Expiration Date:   08/03/2025    Future Appointments  Date Time Provider Department Center  12/01/2024  8:30 AM [facility] None  12/01/2024  9:00 AM Breyden Jeudy, MD [facility] None     I spent a total of 40 minutes during this encounter with the patient including review of chart and various tests results, discussions about plan of care and coordination of care plan.  This document was completed utilizing speech recognition software. Grammatical errors, random word insertions, pronoun errors, and incomplete sentences are an occasional consequence of this system due to software limitations, ambient noise, and hardware issues. Any formal questions or concerns about the content, text or information contained within the body of this dictation should be directly addressed to the provider for clarification.

## 2024-08-04 ENCOUNTER — Encounter: Payer: Self-pay | Admitting: Oncology

## 2024-08-04 DIAGNOSIS — D500 Iron deficiency anemia secondary to blood loss (chronic): Secondary | ICD-10-CM | POA: Insufficient documentation

## 2024-08-04 NOTE — Assessment & Plan Note (Signed)
 Iron deficiency anemia likely secondary to chronic menstrual blood loss. Hemoglobin improved from 8.8 in September to 9 in October.   Iron studies in October 2025 showed slightly elevated iron levels, likely due to supplementation.   Differential diagnosis includes hereditary hemochromatosis and alpha thalassemia, but suspicion is low due to current iron supplementation and lack of family history.   No signs of severe anemia such as chest pain or dyspnea, but reports fatigue and sleepiness. No other sources of blood loss identified.   Oral iron supplementation is well-tolerated without side effects.  Labs today showed much improved hemoglobin of 10.9, MCV normal at 81.2.  White count and platelet count are normal.  Iron studies currently show no evidence of iron deficiency.  I called the patient with results of above workup.  -She was advised to continue oral iron supplementation once daily.  No indication for IV iron currently.  No indication for additional workup.   Educated on taking iron with vitamin C to enhance absorption.  - Scheduled follow-up appointment in four months.

## 2024-12-01 ENCOUNTER — Inpatient Hospital Stay: Attending: Oncology

## 2024-12-01 ENCOUNTER — Inpatient Hospital Stay: Admitting: Oncology
